# Patient Record
Sex: Female | Born: 1940 | Race: White | Hispanic: No | Marital: Married | Smoking: Never smoker
Health system: Southern US, Community
[De-identification: ages and names within clinical notes are randomized; demographics above are authoritative.]

## PROBLEM LIST (undated history)

## (undated) DIAGNOSIS — E119 Type 2 diabetes mellitus without complications: Secondary | ICD-10-CM

## (undated) DIAGNOSIS — I2510 Atherosclerotic heart disease of native coronary artery without angina pectoris: Secondary | ICD-10-CM

## (undated) HISTORY — PX: CARDIAC SURGERY: SHX584

---

## 2004-04-28 ENCOUNTER — Ambulatory Visit: Payer: Self-pay | Admitting: Gastroenterology

## 2004-05-09 ENCOUNTER — Ambulatory Visit: Payer: Self-pay | Admitting: Family Medicine

## 2004-05-16 ENCOUNTER — Ambulatory Visit: Payer: Self-pay | Admitting: Family Medicine

## 2004-05-22 ENCOUNTER — Ambulatory Visit: Payer: Self-pay | Admitting: Family Medicine

## 2004-06-22 ENCOUNTER — Ambulatory Visit: Payer: Self-pay | Admitting: Family Medicine

## 2004-07-22 ENCOUNTER — Ambulatory Visit: Payer: Self-pay | Admitting: Family Medicine

## 2004-08-22 ENCOUNTER — Ambulatory Visit: Payer: Self-pay | Admitting: Family Medicine

## 2004-10-22 ENCOUNTER — Ambulatory Visit: Payer: Self-pay | Admitting: Family Medicine

## 2005-01-03 ENCOUNTER — Ambulatory Visit: Payer: Self-pay | Admitting: Family Medicine

## 2005-02-27 ENCOUNTER — Ambulatory Visit: Payer: Self-pay | Admitting: Surgery

## 2005-05-29 ENCOUNTER — Ambulatory Visit: Payer: Self-pay | Admitting: Family Medicine

## 2005-08-22 ENCOUNTER — Other Ambulatory Visit: Payer: Self-pay

## 2005-08-22 ENCOUNTER — Inpatient Hospital Stay: Payer: Self-pay | Admitting: Internal Medicine

## 2006-07-17 ENCOUNTER — Ambulatory Visit: Payer: Self-pay | Admitting: Family Medicine

## 2007-08-14 ENCOUNTER — Ambulatory Visit: Payer: Self-pay | Admitting: Family Medicine

## 2007-11-23 ENCOUNTER — Ambulatory Visit: Payer: Self-pay | Admitting: Family Medicine

## 2007-12-23 ENCOUNTER — Ambulatory Visit: Payer: Self-pay | Admitting: Family Medicine

## 2008-08-20 ENCOUNTER — Ambulatory Visit: Payer: Self-pay | Admitting: Family Medicine

## 2009-09-08 ENCOUNTER — Ambulatory Visit: Payer: Self-pay | Admitting: Internal Medicine

## 2009-09-14 ENCOUNTER — Emergency Department: Payer: Self-pay | Admitting: Emergency Medicine

## 2010-03-19 ENCOUNTER — Emergency Department: Payer: Self-pay | Admitting: Emergency Medicine

## 2010-10-05 ENCOUNTER — Other Ambulatory Visit (HOSPITAL_COMMUNITY): Payer: Self-pay | Admitting: Radiology

## 2010-10-05 DIAGNOSIS — R55 Syncope and collapse: Secondary | ICD-10-CM

## 2010-10-06 ENCOUNTER — Other Ambulatory Visit (HOSPITAL_COMMUNITY): Payer: Self-pay | Admitting: Radiology

## 2010-10-06 ENCOUNTER — Ambulatory Visit (HOSPITAL_BASED_OUTPATIENT_CLINIC_OR_DEPARTMENT_OTHER): Payer: Medicare Other | Admitting: Radiology

## 2010-10-06 ENCOUNTER — Ambulatory Visit (HOSPITAL_COMMUNITY): Payer: Medicare Other | Attending: Internal Medicine | Admitting: Radiology

## 2010-10-06 DIAGNOSIS — E669 Obesity, unspecified: Secondary | ICD-10-CM | POA: Insufficient documentation

## 2010-10-06 DIAGNOSIS — E119 Type 2 diabetes mellitus without complications: Secondary | ICD-10-CM | POA: Insufficient documentation

## 2010-10-06 DIAGNOSIS — R55 Syncope and collapse: Secondary | ICD-10-CM

## 2010-10-06 DIAGNOSIS — E785 Hyperlipidemia, unspecified: Secondary | ICD-10-CM | POA: Insufficient documentation

## 2010-10-06 DIAGNOSIS — R0609 Other forms of dyspnea: Secondary | ICD-10-CM | POA: Insufficient documentation

## 2010-10-06 DIAGNOSIS — R0989 Other specified symptoms and signs involving the circulatory and respiratory systems: Secondary | ICD-10-CM | POA: Insufficient documentation

## 2010-10-06 DIAGNOSIS — R9431 Abnormal electrocardiogram [ECG] [EKG]: Secondary | ICD-10-CM | POA: Insufficient documentation

## 2010-10-25 ENCOUNTER — Ambulatory Visit: Payer: Self-pay | Admitting: Internal Medicine

## 2011-10-26 ENCOUNTER — Ambulatory Visit: Payer: Self-pay | Admitting: Internal Medicine

## 2011-12-27 ENCOUNTER — Ambulatory Visit: Payer: Self-pay | Admitting: Cardiology

## 2012-03-12 ENCOUNTER — Ambulatory Visit: Payer: Self-pay | Admitting: Internal Medicine

## 2012-05-15 ENCOUNTER — Ambulatory Visit: Payer: Self-pay | Admitting: Cardiology

## 2012-10-28 ENCOUNTER — Ambulatory Visit: Payer: Self-pay | Admitting: Family Medicine

## 2012-11-19 ENCOUNTER — Ambulatory Visit: Payer: Self-pay | Admitting: Family Medicine

## 2013-02-03 ENCOUNTER — Encounter: Payer: Self-pay | Admitting: Cardiovascular Disease

## 2013-02-10 ENCOUNTER — Ambulatory Visit: Payer: Self-pay | Admitting: Gastroenterology

## 2013-02-13 LAB — PATHOLOGY REPORT

## 2013-10-28 ENCOUNTER — Ambulatory Visit: Payer: Self-pay | Admitting: General Practice

## 2013-11-03 ENCOUNTER — Ambulatory Visit: Payer: Self-pay | Admitting: General Practice

## 2013-11-27 LAB — COMPREHENSIVE METABOLIC PANEL
ALBUMIN: 3.9 g/dL (ref 3.4–5.0)
ANION GAP: 10 (ref 7–16)
Alkaline Phosphatase: 59 U/L
BUN: 19 mg/dL — ABNORMAL HIGH (ref 7–18)
Bilirubin,Total: 0.5 mg/dL (ref 0.2–1.0)
CALCIUM: 9.5 mg/dL (ref 8.5–10.1)
CHLORIDE: 98 mmol/L (ref 98–107)
CO2: 28 mmol/L (ref 21–32)
Creatinine: 0.76 mg/dL (ref 0.60–1.30)
GLUCOSE: 162 mg/dL — AB (ref 65–99)
OSMOLALITY: 278 (ref 275–301)
Potassium: 3.9 mmol/L (ref 3.5–5.1)
SGOT(AST): 27 U/L (ref 15–37)
SGPT (ALT): 33 U/L
SODIUM: 136 mmol/L (ref 136–145)
TOTAL PROTEIN: 7.5 g/dL (ref 6.4–8.2)

## 2013-11-27 LAB — CBC
HCT: 38.6 % (ref 35.0–47.0)
HGB: 12.8 g/dL (ref 12.0–16.0)
MCH: 30.9 pg (ref 26.0–34.0)
MCHC: 33 g/dL (ref 32.0–36.0)
MCV: 94 fL (ref 80–100)
Platelet: 274 10*3/uL (ref 150–440)
RBC: 4.12 10*6/uL (ref 3.80–5.20)
RDW: 13.5 % (ref 11.5–14.5)
WBC: 11.2 10*3/uL — ABNORMAL HIGH (ref 3.6–11.0)

## 2013-11-27 LAB — TROPONIN I

## 2013-11-28 ENCOUNTER — Observation Stay: Payer: Self-pay | Admitting: Internal Medicine

## 2013-11-28 LAB — URINALYSIS, COMPLETE
BILIRUBIN, UR: NEGATIVE
BLOOD: NEGATIVE
Bacteria: NONE SEEN
Glucose,UR: 150 mg/dL (ref 0–75)
NITRITE: NEGATIVE
PH: 7 (ref 4.5–8.0)
PROTEIN: NEGATIVE
SPECIFIC GRAVITY: 1.048 (ref 1.003–1.030)

## 2013-11-28 LAB — TROPONIN I

## 2013-12-08 ENCOUNTER — Ambulatory Visit: Payer: Self-pay | Admitting: Family Medicine

## 2014-04-02 ENCOUNTER — Ambulatory Visit: Payer: Self-pay | Admitting: Gastroenterology

## 2014-05-15 NOTE — H&P (Signed)
PATIENT NAME:  Rachel Thompson, Rachel Thompson MR#:  [id number] DATE OF BIRTH:  18-Jun-1940  DATE OF ADMISSION:  11/28/2013  REFERRING PHYSICIAN: Rebecka ApleyAllison P. Webster, MD   PRIMARY CARE PHYSICIAN: Kandyce RudMarcus Babaoff, MD  CARDIOLOGIST: Marcina MillardAlexander Paraschos, MD  CHIEF COMPLAINT: Headache.  HISTORY OF PRESENT ILLNESS: A 74 year old Caucasian female with past medical history of coronary artery disease, status post PCI and stent placement x 2, as well as type 2 diabetes, non-insulin-requiring, uncomplicated, presenting with headache. She describes 1-day duration of headache which was originally frontal in location without radiation, throbbing in quality, 7/10 in intensity, nonradiating. No worsening or relieving factors. Then subsequently developed nausea, vomiting with p.o. intolerance, multiple bouts of nonbloody emesis prompting ER admission. While in the Emergency Department, workup has been essentially negative and ruling out any intracranial process or infectious etiologies, and her symptoms have also resolved, headache, as well vomiting; however, upon ambulation she became lightheaded and felt as if she was going  to pass out. Orthostatic vital signs were positive for orthostasis given change in heart rate. Currently no further complaints other than states "I feel dry."   REVIEW OF SYSTEMS:  CONSTITUTIONAL: Denies fever, chills, fatigue, weakness. EYES: No blurred vision, double vision, or eye pain.   ENT: Denies tinnitus, ear pain, hearing loss.  RESPIRATORY: Denies cough, wheeze, shortness of breath.  CARDIOVASCULAR: Denies chest pain, palpitations, edema. GASTROINTESTINAL: Positive for nausea and vomiting as above. Denies any abdominal pain. GENITOURINARY: Denies dysuria or hematuria. ENDOCRINE: Denied nocturia or thyroid problems. HEMATOLOGIC: Denies easy bruising or bleeding. SKIN: Denies rash or lesions. MUSCULOSKELETAL: Denies pain in neck, shoulder, knees, hip, or arthritic symptoms. NEUROLOGIC: She denies  paralysis or paresthesia.  PSYCHIATRIC: Denies anxiety or depressive symptoms.   Otherwise, full review of systems performed by me is negative.   PAST MEDICAL HISTORY: Gastroesophageal reflux disease; hyperlipidemia; type 2 diabetes, non-insulin-requiring, uncomplicated; hypothyroidism; coronary artery disease, status post PCI and stent placement.   SOCIAL HISTORY: Occasional alcohol use. Denies any drug use or tobacco use.   FAMILY HISTORY: Denies any known cardiovascular or pulmonary disorders.   ALLERGIES: SULFA DRUGS.   HOME MEDICATIONS: Include citalopram 10 mg p.o. daily, Janumet 50/1000 mg p.o. b.i.d., Lipitor 40 mg p.o. at bedtime, flaxseed oil 2 capsules p.o. daily, aspirin 81 mg p.o. 2 tablets daily, unknown dosage of Synthroid though she does take it once daily.   PHYSICAL EXAMINATION:  VITAL SIGNS: Temperature 98, heart rate currently 121, respirations 20, blood pressure 143/83, saturating 93% on room air. Orthostatic vital signs going from lying to standing: Heart rate 117, going up to 134; blood pressure 143/82, 156/78.  GENERAL: A well-nourished, well-developed Caucasian female currently in no acute distress.  HEAD: Normocephalic, atraumatic.  EYES: Pupils equal, round, reactive to light. Extraocular muscle intact. No scleral icterus.  MOUTH: Dry mucosal membranes. Dentition intact. No abscess noted.  EARS, NOSE, AND THROAT: Clear. No exudates. No external lesions.  NECK: Supple. No thyromegaly. No nodules. No JVD.  PULMONARY: Clear to auscultation bilaterally without wheezes, rales, or rhonchi. No use of accessory muscles. Good respiratory effort.   CHEST: Nontender to palpation.  CARDIOVASCULAR: S1, S2, regular rate and rhythm. No murmurs, rubs, or gallops. No edema. Pedal pulses 2+ bilaterally.  GASTROINTESTINAL: Soft, nontender, nondistended. No masses. Positive bowel sounds. No hepatosplenomegaly.  MUSCULOSKELETAL: No swelling, clubbing, or edema. Range of motion full  in all extremities.  NEUROLOGIC: Cranial nerves II through XII intact. No gross focal neurological deficits. Sensation intact. Reflexes intact. SKIN: No ulceration, lesions, cyanosis.  Skin warm, dry. Turgor intact.  PSYCHIATRIC: Mood and affect within normal limits. Awake, alert and oriented x 3. Insight and judgment intact.   LABORATORY DATA: EKG performed reveals right bundle branch block with sinus tachycardia. Sodium 136, potassium 3.9, chloride 98, bicarbonate 28, BUN 19, creatinine 0.76, glucose 162. Troponin less than 0.02. WBC 11.2, hemoglobin 12.8, platelets of 274,000.  Urinalysis: WBCs 13, RBCs 2, leukocyte esterase 1+. CT head performed: No intracranial process. 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   ASSESSMENT AND PLAN: A 74 year old  Caucasian female with history of coronary artery disease, status percutaneous coronary intervention and stent placement x 2 presented with headache. Workup has essentially been negative for headache. Her symptoms of headache as well as nausea and vomiting have resolved. However, when attempting to ambulate, she became lightheaded to the point where she almost passed out.  1.  Presyncope given poor p.o. intake and nausea, vomiting: Provide fluid hydration. Follow urine output and renal function.  2.  Coronary artery disease: Aspirin and statin therapy continuation.  3.  Type 2 diabetes, uncomplicated, non-insulin-requiring: Hold p.o. agents. Add insulin sliding scale q .6 hours, Accu-Cheks.  4.  Deep vein thrombosis prophylaxis: Heparin subcutaneous.  5.  The patient is FULL CODE.  TIME SPENT: 45 minutes.    ____________________________ Cletis Athens. Hower, MD dkh:ts D: 11/28/2013 03:14:00 ET T: 11/28/2013 03:55:16 ET JOB#: [id number]  cc: Cletis Athens. Hower, MD, <Dictator> DAVID Synetta Shadow MD ELECTRONICALLY SIGNED 11/28/2013 20:33

## 2014-05-15 NOTE — Discharge Summary (Signed)
PATIENT NAME:  Rachel Thompson, Rachel Thompson MR#:  [id number] DATE OF BIRTH:  11-May-1940  DATE OF ADMISSION:  11/28/2013 DATE OF DISCHARGE:  11/28/2013  PRIMARY CARDIOLOGIST: Marcina Millard, MD   CHIEF COMPLAINT AT THE TIME OF ADMISSION: Headache.  ADMITTING DIAGNOSES: Presyncope with poor p.o. intake and nausea and vomiting; coronary artery disease; type 2 diabetes mellitus.  DISCHARGE DIAGNOSES:  1.  Presyncope, probably vasovagal and migraine related.  2.  Intermittent episodes of migraine, recommended over-the-counter Excedrin Migraine as needed.   SECONDARY DISCHARGE DIAGNOSES: Coronary artery disease, stable, continue aspirin and statin. Diabetes mellitus, resume her home medications.   CONSULTATIONS: None.   PROCEDURES: None.  BRIEF HISTORY AND PHYSICAL AND HOSPITAL COURSE: The patient is a 74 year old Caucasian female who came into the ED with a chief complaint of headache and dizziness. She had 1 day history of headache in the frontal location, it is a 7/10, nonradiating. Subsequently, she developed nausea and vomiting with p.o. intolerance. The patient had multiple bouts of nonbloody emesis, came into the ED. Please review H and P for details. In the ED, orthostatic vital signs were positive for orthostasis. CAT scan of the head was performed, which has revealed no intracranial process. The patient was admitted to the hospital with a chief diagnosis of presyncope.   HOSPITAL COURSE: IV fluids were given. With IV fluids and antiemetic medications, her nausea and vomiting were resolved, dizziness was resolved. Repeat orthostatic vital signs were normal. Her blood pressure was 109/70 with pulse 114 while lying down and while standing it was 110/72 with pulse 118. Subsequently, the patient's headache was also resolved. The patient has reported that she has history of migraine headaches and she gets them intermittently. Presyncope was assumed to be from vasovagal from nausea and vomiting and nausea  and vomiting, were most probably from migraine headache. The patient's symptoms were completely resolved. Denies any chest pain or shortness of breath. Started feeling better. I have recommended the patient to take over-the-counter Excedrin Migraine as needed basis. The patient felt comfortable to be discharged.   Her initial urinalysis has revealed leukocyte esterase 1+ but the patient denies any abdominal pain, frequent urination, dysuria or burning micturition. As the patient was asymptomatic, antibiotics were not recommended. This was discussed with the patient and the patient also refused to take any p.o. antibiotics or further testing at this point as she was asymptomatic. The patient was discharged home under stable condition.   DIET: Diabetic, healthy heart.  ACTIVITY: As tolerated.   LABORATORIES AND IMAGING STUDIES: CAT scan of the head without contrast: No acute intracranial hemorrhage. No CT evidence of acute infarction. CT angiogram of the head and neck with and without contrast has revealed no aneurysm, vascular malformations. Calcified plaque within the distal left vertebral artery with associated stenosis of up to 30% by NASCET criteria. Left vertebral artery is dominant. CTA neck has revealed no evidence of dissection, vascular occlusion. Moderate calcific plaque at the proximal right internal carotid artery with associated short segment stenosis of 50% by NASCET criteria. Left carotid bifurcation with stenosis of approximately 50% by NASCET criteria.   EKG: Sinus tachycardia with positive left atrial enlargement, right bundle branch block, left anterior fascicular block.   Urinalysis: Leukocyte esterase 1+, nitrites are negative, urine glucose 150 mg/dL, blood negative. LFTs are normal. Troponin less than 0.02 x 3. WBC 11.2, hemoglobin 12.8, hematocrit 38.2, platelets are normal. BMP: Glucose 162, BUN 19, The rest of the BMP is normal.   MEDICATIONS AT THE TIME  OF DISCHARGE: Continue  all her home medications without any new changes. Tylenol 325 mg 1-2 tablets every 4 hours as needed for pain or temperature greater than 100.4, flaxseed oil 2 tablets p.o. once daily, citalopram 10 mg once daily, Janumet 50/ 1000 p.o. 2 times a day, Lipitor 40 mg once daily, fish oil once daily, calcium once daily, multivitamins once daily, baby aspirin 81 mg on a daily basis, levothyroxine dose unknown. Continue home medication at home dose.   FOLLOWUP: With primary care physician in 1-2 weeks and cardiology, Dr. Darrold JunkerParaschos, as scheduled as recommended. The patient is aware of the plan.  TOTAL TIME SPENT ON THE DISCHARGE: Forty-five minutes.    ____________________________ Ramonita LabAruna Ethelwyn Gilbertson, MD ag:TT D: 11/30/2013 20:02:23 ET T: 11/30/2013 20:31:58 ET JOB#: [id number]  cc: Ramonita LabAruna Jaylynn Mcaleer, MD, <Dictator> Kandyce RudMarcus Babaoff, MD Marcina MillardAlexander Paraschos, MD  Ramonita LabARUNA Alonah Lineback MD ELECTRONICALLY SIGNED 12/09/2013 14:10

## 2014-05-15 NOTE — H&P (Signed)
PATIENT NAME:  Rachel Thompson, Rachel Thompson MR#:  [id number] DATE OF BIRTH:  03/19/1940  DATE OF ADMISSION:  11/28/2013  REFERRING PHYSICIAN: Allison P. Webster, MD   PRIMARY CARE PHYSICIAN: Marcus Babaoff, MD  CARDIOLOGIST: Alexander Paraschos, MD  CHIEF COMPLAINT: Headache.  HISTORY OF PRESENT ILLNESS: A 74-year-old Caucasian female with past medical history of coronary artery disease, status post PCI and stent placement x 2, as well as type 2 diabetes, non-insulin-requiring, uncomplicated, presenting with headache. She describes 1-day duration of headache which was originally frontal in location without radiation, throbbing in quality, 7/10 in intensity, nonradiating. No worsening or relieving factors. Then subsequently developed nausea, vomiting with p.o. intolerance, multiple bouts of nonbloody emesis prompting ER admission. While in the Emergency Department, workup has been essentially negative and ruling out any intracranial process or infectious etiologies, and her symptoms have also resolved, headache, as well vomiting; however, upon ambulation she became lightheaded and felt as if she was going  to pass out. Orthostatic vital signs were positive for orthostasis given change in heart rate. Currently no further complaints other than states "I feel dry."   REVIEW OF SYSTEMS:  CONSTITUTIONAL: Denies fever, chills, fatigue, weakness. EYES: No blurred vision, double vision, or eye pain.   ENT: Denies tinnitus, ear pain, hearing loss.  RESPIRATORY: Denies cough, wheeze, shortness of breath.  CARDIOVASCULAR: Denies chest pain, palpitations, edema. GASTROINTESTINAL: Positive for nausea and vomiting as above. Denies any abdominal pain. GENITOURINARY: Denies dysuria or hematuria. ENDOCRINE: Denied nocturia or thyroid problems. HEMATOLOGIC: Denies easy bruising or bleeding. SKIN: Denies rash or lesions. MUSCULOSKELETAL: Denies pain in neck, shoulder, knees, hip, or arthritic symptoms. NEUROLOGIC: She denies  paralysis or paresthesia.  PSYCHIATRIC: Denies anxiety or depressive symptoms.   Otherwise, full review of systems performed by me is negative.   PAST MEDICAL HISTORY: Gastroesophageal reflux disease; hyperlipidemia; type 2 diabetes, non-insulin-requiring, uncomplicated; hypothyroidism; coronary artery disease, status post PCI and stent placement.   SOCIAL HISTORY: Occasional alcohol use. Denies any drug use or tobacco use.   FAMILY HISTORY: Denies any known cardiovascular or pulmonary disorders.   ALLERGIES: SULFA DRUGS.   HOME MEDICATIONS: Include citalopram 10 mg p.o. daily, Janumet 50/1000 mg p.o. b.i.d., Lipitor 40 mg p.o. at bedtime, flaxseed oil 2 capsules p.o. daily, aspirin 81 mg p.o. 2 tablets daily, unknown dosage of Synthroid though she does take it once daily.   PHYSICAL EXAMINATION:  VITAL SIGNS: Temperature 98, heart rate currently 121, respirations 20, blood pressure 143/83, saturating 93% on room air. Orthostatic vital signs going from lying to standing: Heart rate 117, going up to 134; blood pressure 143/82, 156/78.  GENERAL: A well-nourished, well-developed Caucasian female currently in no acute distress.  HEAD: Normocephalic, atraumatic.  EYES: Pupils equal, round, reactive to light. Extraocular muscle intact. No scleral icterus.  MOUTH: Dry mucosal membranes. Dentition intact. No abscess noted.  EARS, NOSE, AND THROAT: Clear. No exudates. No external lesions.  NECK: Supple. No thyromegaly. No nodules. No JVD.  PULMONARY: Clear to auscultation bilaterally without wheezes, rales, or rhonchi. No use of accessory muscles. Good respiratory effort.   CHEST: Nontender to palpation.  CARDIOVASCULAR: S1, S2, regular rate and rhythm. No murmurs, rubs, or gallops. No edema. Pedal pulses 2+ bilaterally.  GASTROINTESTINAL: Soft, nontender, nondistended. No masses. Positive bowel sounds. No hepatosplenomegaly.  MUSCULOSKELETAL: No swelling, clubbing, or edema. Range of motion full  in all extremities.  NEUROLOGIC: Cranial nerves II through XII intact. No gross focal neurological deficits. Sensation intact. Reflexes intact. SKIN: No ulceration, lesions, cyanosis.   Skin warm, dry. Turgor intact.  PSYCHIATRIC: Mood and affect within normal limits. Awake, alert and oriented x 3. Insight and judgment intact.   LABORATORY DATA: EKG performed reveals right bundle branch block with sinus tachycardia. Sodium 136, potassium 3.9, chloride 98, bicarbonate 28, BUN 19, creatinine 0.76, glucose 162. Troponin less than 0.02. WBC 11.2, hemoglobin 12.8, platelets of 274,000.  Urinalysis: WBCs 13, RBCs 2, leukocyte esterase 1+. CT head performed: No intracranial process. 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   ASSESSMENT AND PLAN: A 74-year-old  Caucasian female with history of coronary artery disease, status percutaneous coronary intervention and stent placement x 2 presented with headache. Workup has essentially been negative for headache. Her symptoms of headache as well as nausea and vomiting have resolved. However, when attempting to ambulate, she became lightheaded to the point where she almost passed out.  1.  Presyncope given poor p.o. intake and nausea, vomiting: Provide fluid hydration. Follow urine output and renal function.  2.  Coronary artery disease: Aspirin and statin therapy continuation.  3.  Type 2 diabetes, uncomplicated, non-insulin-requiring: Hold p.o. agents. Add insulin sliding scale q .6 hours, Accu-Cheks.  4.  Deep vein thrombosis prophylaxis: Heparin subcutaneous.  5.  The patient is FULL CODE.  TIME SPENT: 45 minutes.    ____________________________ David K. Hower, MD dkh:ts D: 11/28/2013 03:14:00 ET T: 11/28/2013 03:55:16 ET JOB#: [id number]  cc: David K. Hower, MD, <Dictator> DAVID K HOWER MD ELECTRONICALLY SIGNED 11/28/2013 20:33 

## 2014-05-17 LAB — SURGICAL PATHOLOGY

## 2014-12-21 ENCOUNTER — Emergency Department
Admission: EM | Admit: 2014-12-21 | Discharge: 2014-12-21 | Disposition: A | Discharge status: Home / Self Care | Payer: Medicare Other | Attending: Emergency Medicine | Admitting: Emergency Medicine

## 2014-12-21 ENCOUNTER — Encounter: Payer: Self-pay | Admitting: Emergency Medicine

## 2014-12-21 ENCOUNTER — Emergency Department: Payer: Medicare Other

## 2014-12-21 DIAGNOSIS — W1839XA Other fall on same level, initial encounter: Secondary | ICD-10-CM | POA: Diagnosis not present

## 2014-12-21 DIAGNOSIS — S3991XA Unspecified injury of abdomen, initial encounter: Secondary | ICD-10-CM | POA: Diagnosis present

## 2014-12-21 DIAGNOSIS — S3692XA Contusion of unspecified intra-abdominal organ, initial encounter: Secondary | ICD-10-CM

## 2014-12-21 DIAGNOSIS — E119 Type 2 diabetes mellitus without complications: Secondary | ICD-10-CM | POA: Insufficient documentation

## 2014-12-21 DIAGNOSIS — IMO0001 Reserved for inherently not codable concepts without codable children: Secondary | ICD-10-CM

## 2014-12-21 DIAGNOSIS — Y9289 Other specified places as the place of occurrence of the external cause: Secondary | ICD-10-CM | POA: Diagnosis not present

## 2014-12-21 DIAGNOSIS — Y9389 Activity, other specified: Secondary | ICD-10-CM | POA: Diagnosis not present

## 2014-12-21 DIAGNOSIS — S301XXA Contusion of abdominal wall, initial encounter: Secondary | ICD-10-CM | POA: Diagnosis not present

## 2014-12-21 DIAGNOSIS — Y998 Other external cause status: Secondary | ICD-10-CM | POA: Diagnosis not present

## 2014-12-21 HISTORY — DX: Type 2 diabetes mellitus without complications: E11.9

## 2014-12-21 HISTORY — DX: Atherosclerotic heart disease of native coronary artery without angina pectoris: I25.10

## 2014-12-21 LAB — URINALYSIS COMPLETE WITH MICROSCOPIC (ARMC ONLY)
Bacteria, UA: NONE SEEN
Bilirubin Urine: NEGATIVE
Glucose, UA: NEGATIVE mg/dL
Hgb urine dipstick: NEGATIVE
KETONES UR: NEGATIVE mg/dL
Nitrite: NEGATIVE
PROTEIN: NEGATIVE mg/dL
SQUAMOUS EPITHELIAL / LPF: NONE SEEN
Specific Gravity, Urine: 1.011 (ref 1.005–1.030)
pH: 6 (ref 5.0–8.0)

## 2014-12-21 LAB — CBC WITH DIFFERENTIAL/PLATELET
Basophils Absolute: 0.1 10*3/uL (ref 0–0.1)
Basophils Relative: 1 %
EOS ABS: 0.2 10*3/uL (ref 0–0.7)
EOS PCT: 3 %
HCT: 35.8 % (ref 35.0–47.0)
Hemoglobin: 11.8 g/dL — ABNORMAL LOW (ref 12.0–16.0)
LYMPHS ABS: 2.1 10*3/uL (ref 1.0–3.6)
Lymphocytes Relative: 27 %
MCH: 28.9 pg (ref 26.0–34.0)
MCHC: 32.9 g/dL (ref 32.0–36.0)
MCV: 87.9 fL (ref 80.0–100.0)
MONOS PCT: 10 %
Monocytes Absolute: 0.7 10*3/uL (ref 0.2–0.9)
Neutro Abs: 4.7 10*3/uL (ref 1.4–6.5)
Neutrophils Relative %: 59 %
PLATELETS: 303 10*3/uL (ref 150–440)
RBC: 4.08 MIL/uL (ref 3.80–5.20)
RDW: 13.8 % (ref 11.5–14.5)
WBC: 7.8 10*3/uL (ref 3.6–11.0)

## 2014-12-21 LAB — COMPREHENSIVE METABOLIC PANEL
ALK PHOS: 60 U/L (ref 38–126)
ALT: 29 U/L (ref 14–54)
ANION GAP: 10 (ref 5–15)
AST: 27 U/L (ref 15–41)
Albumin: 4.3 g/dL (ref 3.5–5.0)
BUN: 17 mg/dL (ref 6–20)
CALCIUM: 9.8 mg/dL (ref 8.9–10.3)
CHLORIDE: 102 mmol/L (ref 101–111)
CO2: 27 mmol/L (ref 22–32)
Creatinine, Ser: 0.71 mg/dL (ref 0.44–1.00)
Glucose, Bld: 147 mg/dL — ABNORMAL HIGH (ref 65–99)
Potassium: 3.9 mmol/L (ref 3.5–5.1)
SODIUM: 139 mmol/L (ref 135–145)
TOTAL PROTEIN: 8 g/dL (ref 6.5–8.1)
Total Bilirubin: 0.6 mg/dL (ref 0.3–1.2)

## 2014-12-21 MED ORDER — IOHEXOL 350 MG/ML SOLN
100.0000 mL | Freq: Once | INTRAVENOUS | Status: AC | PRN
  Administered 2014-12-21: 100 mL via INTRAVENOUS
  Filled 2014-12-21: qty 100

## 2014-12-21 MED ORDER — OXYCODONE-ACETAMINOPHEN 5-325 MG PO TABS
1.0000 | ORAL_TABLET | Freq: Once | ORAL | Status: AC
  Administered 2014-12-21: 1 via ORAL
  Filled 2014-12-21: qty 1

## 2014-12-21 MED ORDER — DOCUSATE SODIUM 100 MG PO CAPS: 100.0000 mg | ORAL_CAPSULE | Freq: Every day | ORAL | Status: AC | PRN

## 2014-12-21 MED ORDER — OXYCODONE-ACETAMINOPHEN 5-325 MG PO TABS: 1.0000 | ORAL_TABLET | Freq: Four times a day (QID) | ORAL | Status: DC | PRN

## 2014-12-21 MED ORDER — IBUPROFEN 400 MG PO TABS
600.0000 mg | ORAL_TABLET | Freq: Once | ORAL | Status: AC
  Administered 2014-12-21: 600 mg via ORAL
  Filled 2014-12-21: qty 2

## 2014-12-21 MED ORDER — IOHEXOL 240 MG/ML SOLN: 25.0000 mL | Freq: Once | INTRAMUSCULAR | Status: DC | PRN

## 2014-12-21 NOTE — ED Notes (Signed)
Pt has right upper quad pain.  Pt reports falling 1 week ago in a parking lot and has a bruise to right upper quad. Pt tripped over a cement block.  No loc.  No headache or neck pain.  Denies back pain.   Today pain below bruise.  No back pain.  No n/v/d.    Pt alert.  Speech clear.

## 2014-12-21 NOTE — ED Notes (Signed)
Pt returned from CT °

## 2014-12-21 NOTE — Discharge Instructions (Signed)
If you have increased pain, shortness of breath, fever, vomiting, chest pain, or you feel worse in any way, return to the emergency department. Please bring this discharge instruction to your doctor, as it contains (copied here below) your ct findings.  These will need to be followed up upon, specifically the lung nodule. Take pain medication to control your pain and return if you feel worse.   IMPRESSION, CT ABDOMEN: 1. No evidence of significant acute traumatic injury in the abdomen or pelvis. 2. No acute findings in the abdomen or pelvis to account for the patient's symptoms. 3. 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 4. Atherosclerosis including right coronary artery disease. Assessment for potential risk factor modification, dietary therapy or pharmacologic therapy may be warranted, if clinically indicated. 5. Moderate size hiatal hernia. 6. Incompletely visualized ground-glass attenuation nodule in the left lower lobe measuring at least 9 mm. Initial follow-up by chest CT without contrast is recommended in 3 months to confirm persistence. This recommendation follows the consensus statement: Recommendations for the Management of Subsolid Pulmonary Nodules Detected at CT: A Statement from the Fleischner Society as published in Radiology 2013; 266:304-317. 7. Mild colonic diverticulosis without evidence to suggest an acute diverticulitis at this time.   Blunt Abdominal Trauma Blunt abdominal trauma is a type of injury that involves damage to the abdominal wall or to abdominal organs, such as the liver or spleen. The damage can involve bruising, tearing, or a rupture. This type of injury does not involve a puncture of the skin. Blunt abdominal trauma can range from mild to severe. In some cases it can lead to a severe abdominal inflammation (peritonitis), severe bleeding, and a dangerous drop in blood pressure. CAUSES This injury is caused by a hard,  direct hit to the abdomen. It can happen after:  A motor vehicle accident.  Being kicked or punched in the abdomen.  Falling from a significant height. RISK FACTORS This injury is more likely to happen in people who:  Play contact sports.  Work in a job in which falls or injuries are more likely, such as in Holiday representative. SYMPTOMS The main symptom of this condition is pain in the abdomen. Other symptoms depend on the type and location of the injury. They can include:  Abdominal pain that spreads to the the back or shoulder.  Bruising.  Swelling.  Pain when pressing on the abdomen.  Blood in the urine.  Weakness.  Confusion.  Loss of consciousness.  Pale, dusky, cool, or sweaty skin.  Vomiting blood.  Bloody stool or bleeding from the rectum.  Trouble breathing. Symptoms of this injury can develop suddenly or slowly.  DIAGNOSIS This injury is diagnosed based on your symptoms and a physical exam. You may also have tests, including:  Blood tests.  Urine tests.  Imaging tests, such as:  A CT scan and ultrasound of your abdomen.  X-rays of your chest and abdomen.  A test in which a tube is used to flush your abdomen with fluid and check for blood (diagnostic peritoneal lavage). TREATMENT Treatment for this injury depends on its type and severity. Treatment options include:  Observation. If the injury is mild, this may be the only treatment needed.  Support of your blood pressure and breathing.  Getting blood, fluids, or medicine through an IV tube.  Antibiotic medicine.  Insertion of tubes into the stomach or bladder.  A blood transfusion.  A procedure to stop bleeding. This involves putting a long,  thin tube (catheter) into one of your blood vessels (angiographic embolization).  Surgery to open up your abdomen and control bleeding or repair damage (laparotomy). This may be done if tests suggest that you have peritonitis or bleeding that cannot be  controlled with angiographic embolization. HOME CARE INSTRUCTIONS  Take medicines only as directed by your health care provider.  If you were prescribed an antibiotic medicine, finish all of it even if you start to feel better.  Follow your health care provider's instructions about diet and activity restrictions.  Keep all follow-up visits as directed by your health care provider. This is important. SEEK MEDICAL CARE IF:  You continue to have abdominal pain.  Your symptoms return.  You develop new symptoms.  You have blood in your urine or your bowel movements. SEEK IMMEDIATE MEDICAL CARE IF:  You vomit blood.  You have heavy bleeding from your rectum.  You have very bad abdominal pain.  You have trouble breathing.  You have chest pain.  You have a fever.  You have dizziness.  You pass out.   This information is not intended to replace advice given to you by your health care provider. Make sure you discuss any questions you have with your health care provider.   Document Released: 02/16/2004 Document Revised: 05/25/2014 Document Reviewed: 12/30/2013 Elsevier Interactive Patient Education [employer]

## 2014-12-21 NOTE — ED Provider Notes (Addendum)
[facility] Emergency Department Provider Note  ____________________________________________   I have reviewed the triage vital signs and the nursing notes.   HISTORY  Chief Complaint Flank Pain    HPI Rachel Thompson is a 74 y.o. female who is healthy, on a baby aspirin otherwise on no other significant anticoagulation medications presents today with pain in her right upper quadrant. She states she had anoxic following week ago had some pain in her wrist but now the pain seems to have migrated into her abdomen and she had significant worsening of this abdominal pain in the right upper quadrant this afternoon. She's had no nausea no vomiting no diarrhea no hematemesis no bright red blood per rectum no melena she's been eating and drinking well with no fevers and she is not lightheaded. The pain is underneath the ribs on the right. She states that this pain has been there since this afternoon and it feels different from the pain that she had initially when she fell and hit her chest. It is worse when she touches it or changes position and she does not want any pain medication.  Past Medical History  Diagnosis Date  . Diabetes mellitus without complication (HCC)   . Coronary artery disease     There are no active problems to display for this patient.   Past Surgical History  Procedure Laterality Date  . Cardiac surgery      cardiac stents    No current outpatient prescriptions on file.  Allergies Sulfa antibiotics  No family history on file.  Social History Social History  Substance Use Topics  . Smoking status: Never Smoker   . Smokeless tobacco: Not on file  . Alcohol Use: Yes     Comment: socially    Review of Systems Constitutional: No fever/chills Eyes: No visual changes. ENT: No sore throat. No stiff neck no neck pain Cardiovascular: Denies chest pain. Respiratory: Denies shortness of breath. Gastrointestinal:   no vomiting.  No  diarrhea.  No constipation. Genitourinary: Negative for dysuria. Musculoskeletal: Negative lower extremity swelling Skin: Negative for rash. Neurological: Negative for headaches, focal weakness or numbness. 10-point ROS otherwise negative.  ____________________________________________   PHYSICAL EXAM:  VITAL SIGNS: ED Triage Vitals  Enc Vitals Group     BP 12/21/14 1729 158/75 mmHg     Pulse Rate 12/21/14 1729 57     Resp 12/21/14 1729 16     Temp 12/21/14 1729 97.4 F (36.3 C)     Temp Source 12/21/14 1729 Oral     SpO2 12/21/14 1729 99 %     Weight 12/21/14 1729 160 lb (72.576 kg)     Height 12/21/14 1729  (1.626 m)     Head Cir --      Peak Flow --      Pain Score 12/21/14 1731 8     Pain Loc --      Pain Edu? --      Excl. in GC? --     Constitutional: Alert and oriented. Well appearing and in no acute distress. Eyes: Conjunctivae are normal. PERRL. EOMI. Head: Atraumatic. Nose: No congestion/rhinnorhea. Mouth/Throat: Mucous membranes are moist.  Oropharynx non-erythematous. Neck: No stridor.   Nontender with no meningismus Cardiovascular: Normal rate, regular rhythm. Grossly normal heart sounds.  Good peripheral circulation. There is minimal tenderness to palpation of the ribs on the lower right. Respiratory: Normal respiratory effort.  No retractions. Lungs CTAB. Abdominal: There is some old bruising noted to the  right for quadrant and there is tender to palpation in the right upper quadrant which is not insignificant. There is voluntary guarding but no rebound. Abdomen is soft and obese. Back:  There is no focal tenderness or step off there is no midline tenderness there are no lesions noted. there is no CVA tenderness Musculoskeletal: No lower extremity tenderness. No joint effusions, no DVT signs strong distal pulses no edema Neurologic:  Normal speech and language. No gross focal neurologic deficits are appreciated.  Skin:  Skin is warm, dry and intact. No  rash noted. Psychiatric: Mood and affect are normal. Speech and behavior are normal.  ____________________________________________   LABS (all labs ordered are listed, but only abnormal results are displayed)  Labs Reviewed  CBC WITH DIFFERENTIAL/PLATELET - Abnormal; Notable for the following:    Hemoglobin 11.8 (*)    All other components within normal limits  COMPREHENSIVE METABOLIC PANEL - Abnormal; Notable for the following:    Glucose, Bld 147 (*)    All other components within normal limits  URINALYSIS COMPLETEWITH MICROSCOPIC ([facility] ONLY) - Abnormal; Notable for the following:    Color, Urine YELLOW (*)    APPearance CLEAR (*)    Leukocytes, UA 1+ (*)    All other components within normal limits   ____________________________________________  EKG  I personally interpreted any EKGs ordered by me or triage  ____________________________________________  RADIOLOGY  I reviewed any imaging ordered by me or triage that were performed during my shift ____________________________________________   PROCEDURES  Procedure(s) performed: None  Critical Care performed: None  ____________________________________________   INITIAL IMPRESSION / ASSESSMENT AND PLAN / ED COURSE  Pertinent labs & imaging results that were available during my care of the patient were reviewed by me and considered in my medical decision making (see chart for details).  Patient who fell a week ago has acute worsening of her right-sided pain in the exact area where she has a bruise but it does feel as if there could be potential for other intra-abdominal pathology. It would be unlikely for the patient have a gallbladder attack at the same time she has a bruise in that area, this certainly could just be the obvious abdominal wall hematoma which is causing her discomfort but I'll obtain a CT scan to further evaluate the structures therein. No evidence of pneumothorax  noted.   ----------------------------------------- 7:59 PM on 12/21/2014 -----------------------------------------  Patient made aware of all the CT findings, there is no acute intra-abdominal pathology that requires attention today. She is made aware of the lung nodules the patient has very reproducible abdominal wall pain right where she has a bruise from a fall with no evidence of intra-abdominal trauma or pathology. I do not take this represents a PE ACS or dissection. We will give her pain medication of which she is actually taken none since she fell and we will reassess. Patient understands the need for outpatient follow-up of her lung nodule and other CT findings.    ____________________________________________   FINAL CLINICAL IMPRESSION(S) / ED DIAGNOSES  Final diagnoses:  None     Jeanmarie PlantJames A McShane, MD 12/21/14 1848  Jeanmarie PlantJames A McShane, MD 12/21/14 [phone]

## 2014-12-21 NOTE — ED Notes (Signed)
Patient transported to CT 

## 2014-12-21 NOTE — ED Notes (Signed)
Fell after tripping in parking lot one week ago.  Initially c/o right rib pain that had improved.  Today RUQ pain began.  Pain worse with deep breathing.

## 2015-05-02 ENCOUNTER — Other Ambulatory Visit: Payer: Self-pay | Admitting: Family Medicine

## 2015-05-02 DIAGNOSIS — Z1231 Encounter for screening mammogram for malignant neoplasm of breast: Secondary | ICD-10-CM

## 2015-05-16 ENCOUNTER — Ambulatory Visit
Admission: RE | Admit: 2015-05-16 | Discharge: 2015-05-16 | Disposition: A | Discharge status: Home / Self Care | Payer: Medicare Other | Source: Ambulatory Visit | Attending: Family Medicine | Admitting: Family Medicine

## 2015-05-16 ENCOUNTER — Other Ambulatory Visit: Payer: Self-pay | Admitting: Family Medicine

## 2015-05-16 DIAGNOSIS — Z1231 Encounter for screening mammogram for malignant neoplasm of breast: Secondary | ICD-10-CM | POA: Insufficient documentation

## 2016-02-26 ENCOUNTER — Emergency Department: Payer: Medicare Other

## 2016-02-26 ENCOUNTER — Encounter: Payer: Self-pay | Admitting: Emergency Medicine

## 2016-02-26 ENCOUNTER — Emergency Department
Admission: EM | Admit: 2016-02-26 | Discharge: 2016-02-26 | Disposition: A | Discharge status: Home / Self Care | Payer: Medicare Other | Attending: Emergency Medicine | Admitting: Emergency Medicine

## 2016-02-26 DIAGNOSIS — I2510 Atherosclerotic heart disease of native coronary artery without angina pectoris: Secondary | ICD-10-CM | POA: Insufficient documentation

## 2016-02-26 DIAGNOSIS — M546 Pain in thoracic spine: Secondary | ICD-10-CM | POA: Insufficient documentation

## 2016-02-26 DIAGNOSIS — M542 Cervicalgia: Secondary | ICD-10-CM | POA: Diagnosis not present

## 2016-02-26 DIAGNOSIS — Z79899 Other long term (current) drug therapy: Secondary | ICD-10-CM | POA: Diagnosis not present

## 2016-02-26 DIAGNOSIS — M549 Dorsalgia, unspecified: Secondary | ICD-10-CM

## 2016-02-26 DIAGNOSIS — R079 Chest pain, unspecified: Secondary | ICD-10-CM | POA: Insufficient documentation

## 2016-02-26 DIAGNOSIS — E119 Type 2 diabetes mellitus without complications: Secondary | ICD-10-CM | POA: Insufficient documentation

## 2016-02-26 LAB — CBC
HEMATOCRIT: 34.1 % — AB (ref 35.0–47.0)
HEMOGLOBIN: 11.7 g/dL — AB (ref 12.0–16.0)
MCH: 30.8 pg (ref 26.0–34.0)
MCHC: 34.2 g/dL (ref 32.0–36.0)
MCV: 90.1 fL (ref 80.0–100.0)
PLATELETS: 307 10*3/uL (ref 150–440)
RBC: 3.78 MIL/uL — AB (ref 3.80–5.20)
RDW: 12.9 % (ref 11.5–14.5)
WBC: 5.2 10*3/uL (ref 3.6–11.0)

## 2016-02-26 LAB — DIFFERENTIAL
BASOS PCT: 1 %
Basophils Absolute: 0.1 10*3/uL (ref 0–0.1)
EOS ABS: 0.1 10*3/uL (ref 0–0.7)
Eosinophils Relative: 2 %
Lymphocytes Relative: 34 %
Lymphs Abs: 1.7 10*3/uL (ref 1.0–3.6)
MONO ABS: 0.6 10*3/uL (ref 0.2–0.9)
Monocytes Relative: 11 %
NEUTROS ABS: 2.7 10*3/uL (ref 1.4–6.5)
Neutrophils Relative %: 52 %

## 2016-02-26 LAB — COMPREHENSIVE METABOLIC PANEL
ALT: 19 U/L (ref 14–54)
ANION GAP: 10 (ref 5–15)
AST: 22 U/L (ref 15–41)
Albumin: 4.2 g/dL (ref 3.5–5.0)
Alkaline Phosphatase: 51 U/L (ref 38–126)
BUN: 28 mg/dL — ABNORMAL HIGH (ref 6–20)
CHLORIDE: 102 mmol/L (ref 101–111)
CO2: 26 mmol/L (ref 22–32)
Calcium: 9.4 mg/dL (ref 8.9–10.3)
Creatinine, Ser: 0.92 mg/dL (ref 0.44–1.00)
GFR, EST NON AFRICAN AMERICAN: 59 mL/min — AB (ref >60)
Glucose, Bld: 169 mg/dL — ABNORMAL HIGH (ref 65–99)
POTASSIUM: 4.3 mmol/L (ref 3.5–5.1)
SODIUM: 138 mmol/L (ref 135–145)
Total Bilirubin: 0.8 mg/dL (ref 0.3–1.2)
Total Protein: 7.6 g/dL (ref 6.5–8.1)

## 2016-02-26 LAB — TROPONIN I

## 2016-02-26 MED ORDER — MORPHINE SULFATE (PF) 2 MG/ML IV SOLN
2.0000 mg | Freq: Once | INTRAVENOUS | Status: AC
  Administered 2016-02-26: 2 mg via INTRAVENOUS
  Filled 2016-02-26: qty 1

## 2016-02-26 MED ORDER — ONDANSETRON HCL 4 MG/2ML IJ SOLN
4.0000 mg | Freq: Once | INTRAMUSCULAR | Status: AC
  Administered 2016-02-26: 4 mg via INTRAVENOUS
  Filled 2016-02-26: qty 2

## 2016-02-26 MED ORDER — HYDROCODONE-ACETAMINOPHEN 5-325 MG PO TABS: 0.5000 | ORAL_TABLET | ORAL | 0 refills | Status: DC | PRN

## 2016-02-26 MED ORDER — IOPAMIDOL (ISOVUE-370) INJECTION 76%
75.0000 mL | Freq: Once | INTRAVENOUS | Status: AC | PRN
  Administered 2016-02-26: 75 mL via INTRAVENOUS

## 2016-02-26 MED ORDER — ASPIRIN 81 MG PO CHEW
324.0000 mg | CHEWABLE_TABLET | Freq: Once | ORAL | Status: AC
  Administered 2016-02-26: 324 mg via ORAL
  Filled 2016-02-26: qty 4

## 2016-02-26 NOTE — ED Provider Notes (Signed)
[facility] Emergency Department Provider Note   ____________________________________________   First MD Initiated Contact with Patient 02/26/16 1102     (approximate)  I have reviewed the triage vital signs and the nursing notes.   HISTORY  Chief Complaint Back Pain    HPI Rachel Thompson is a 76 y.o. female history of known coronary disease, 2 previous stents  The patient presents today, reports that about 9:45 AM she began experiencing severe mid upper back pain. It shoots up from the middle of her back and radiates towards her shoulder blades on both sides. It began suddenly while she was bending forward in the bathroom. She did not feel any crack or pop, and she reports a sharp pain that radiates up towards her shoulders bilateral. 10 out of 10.  She does report that on Thursday she tripped and fell forward catching herself with both arms without sustaining injury other than a small abrasion on her right knee.   No shortness of breath. No nausea or vomiting. Denies "chest pain". No abdominal pain. No numbness tingling or weakness in the arms or legs.  Past Medical History:  Diagnosis Date  . Coronary artery disease   . Diabetes mellitus without complication (HCC)     There are no active problems to display for this patient.   Past Surgical History:  Procedure Laterality Date  . CARDIAC SURGERY     cardiac stents    Prior to Admission medications   Medication Sig Start Date End Date Taking? Authorizing Provider  HYDROcodone-acetaminophen (NORCO/VICODIN) 5-325 MG tablet Take 0.5 tablets by mouth every 4 (four) hours as needed for severe pain. 02/26/16   Sharyn Creamer, MD  oxyCODONE-acetaminophen (ROXICET) 5-325 MG tablet Take 1 tablet by mouth every 6 (six) hours as needed. 12/21/14   Jeanmarie Plant, MD    Allergies Sulfa antibiotics  Family History  Problem Relation Age of Onset  . Breast cancer Mother 56    Social History Social  History  Substance Use Topics  . Smoking status: Never Smoker  . Smokeless tobacco: Not on file  . Alcohol use Yes     Comment: socially    Review of Systems Constitutional: No fever/chills Eyes: No visual changes. ENT: No sore throat. Cardiovascular: Denies chest pain. Respiratory: Denies shortness of breath. Gastrointestinal: No abdominal pain.  No nausea, no vomiting.  No diarrhea.  No constipation. Genitourinary: Negative for dysuria. Musculoskeletal: See history of present illness, denies neck pain or lower back pain Skin: Negative for rash. Neurological: Negative for headaches, focal weakness or numbness.  Denies any ripping tearing or moving pain  10-point ROS otherwise negative.  ____________________________________________   PHYSICAL EXAM:  VITAL SIGNS: ED Triage Vitals  Enc Vitals Group     BP 02/26/16 1017 (!) 184/78     Pulse Rate 02/26/16 1017 78     Resp 02/26/16 1100 (!) 9     Temp 02/26/16 1017 97.7 F (36.5 C)     Temp Source 02/26/16 1017 Oral     SpO2 02/26/16 1017 98 %     Weight 02/26/16 1018 150 lb (68 kg)     Height 02/26/16 1018 5\' 3"  (1.6 m)     Head Circumference --      Peak Flow --      Pain Score 02/26/16 1018 10     Pain Loc --      Pain Edu? --      Excl. in GC? --  Constitutional: Alert and oriented. Appears in moderate pain, reports pain in her upper mid back. She does appear very uncomfortable. She is very pleasant. Eyes: Conjunctivae are normal. PERRL. EOMI. Head: Atraumatic. Nose: No congestion/rhinnorhea. Mouth/Throat: Mucous membranes are moist.  Oropharynx non-erythematous. Neck: No stridor.  No cervical spine tenderness. There is no reproducible midline thoracic or lumbar tenderness or deformity noted. No rashes on the back. No bruising. Cardiovascular: Normal rate, regular rhythm. Grossly normal heart sounds.  Good peripheral circulation. Respiratory: Normal respiratory effort.  No retractions. Lungs  CTAB. Gastrointestinal: Soft and nontender. No distention.  Musculoskeletal: No lower extremity tenderness nor edema.   Neurologic:  Normal speech and language. No gross focal neurologic deficits are appreciated. Strong palpable pulses in the feet bilaterally. No peripheral cyanosis. Skin:  Skin is warm, dry and intact. No rash noted. Psychiatric: Mood and affect are normal. Speech and behavior are normal.  ____________________________________________   LABS (all labs ordered are listed, but only abnormal results are displayed)  Labs Reviewed  CBC - Abnormal; Notable for the following:       Result Value   RBC 3.78 (*)    Hemoglobin 11.7 (*)    HCT 34.1 (*)    All other components within normal limits  COMPREHENSIVE METABOLIC PANEL - Abnormal; Notable for the following:    Glucose, Bld 169 (*)    BUN 28 (*)    GFR calc non Af Amer 59 (*)    All other components within normal limits  DIFFERENTIAL  TROPONIN I  TROPONIN I   ____________________________________________  EKG  Reviewed and interpreted by me at 10:15 AM Heart rate 80 QRS 128 QTC 500 Normal sinus rhythm, mild prolongation of the QT interval, right bundle branch block without ischemic change noted, possible old septal infarct Compared with her previous EKG from 11/28/2013, no significant changes noted ____________________________________________  RADIOLOGY  Dg Chest 2 View  Result Date: 02/26/2016 CLINICAL DATA:  Chest pain.  Fell 2 days ago. EXAM: CHEST  2 VIEW COMPARISON:  Chest x-ray 12/21/2014 FINDINGS: The cardiac silhouette, mediastinal and hilar contours are normal and stable. There is tortuosity and calcification of the thoracic aorta. Mild chronic bronchitic changes are noted. No definite infiltrates or effusions. Vague densities in the right lung laterally appear relatively stable. They could be related to remote rib injuries. No obvious acute rib fracture. IMPRESSION: No acute cardiopulmonary findings.  Vague densities in the right lower lobe laterally appears stable since prior study and may be related to remote trauma. No definite acute fractures. Electronically Signed   By: Rudie Meyer M.D.   On: 02/26/2016 12:16   Ct Angio Chest Pe W Or Wo Contrast  Result Date: 02/26/2016 CLINICAL DATA:  Increased neck and chest pain radiating to back. EXAM: CT ANGIOGRAPHY CHEST WITH CONTRAST TECHNIQUE: Multidetector CT imaging of the chest was performed using the standard protocol during bolus administration of intravenous contrast. Multiplanar CT image reconstructions and MIPs were obtained to evaluate the vascular anatomy. CONTRAST:  75 cc Isovue 370 COMPARISON:  Chest CT angiogram dated 08/22/2005. FINDINGS: Cardiovascular: There is no pulmonary embolism identified within the main, lobar or segmental pulmonary arteries bilaterally. Aortic atherosclerosis. No aortic aneurysm or dissection. Heart size is upper normal. No pericardial effusion. Coronary artery calcifications noted. Mediastinum/Nodes: No mass or enlarged lymph nodes within the mediastinum, perihilar or axillary regions. Small hiatal hernia. Trachea and central bronchi are unremarkable. Lungs/Pleura: Small nodular density at the left lung base (series 6, image 40) is stable indicating  benignity. No pneumonia. No pleural effusion or pneumothorax. Upper Abdomen: No acute abnormality. Musculoskeletal: Mild degenerative spurring within the mid thoracic spine with kyphosis. Old healed rib fractures on the right. No acute or suspicious osseous finding. Review of the MIP images confirms the above findings. IMPRESSION: 1. No acute findings. No pulmonary embolism identified. No pneumonia or pulmonary edema. 2. Coronary artery calcifications, particularly dense within the left main and left anterior descending coronary arteries. Recommend correlation with any possible associated cardiac symptoms. 3. Aortic atherosclerosis. 4. Small hiatal hernia. Electronically  Signed   By: Bary Richard M.D.   On: 02/26/2016 14:14   Ct Cervical Spine Wo Contrast  Result Date: 02/26/2016 CLINICAL DATA:  Worsening neck and chest pain radiating to the back/ shoulders. No reported injury. EXAM: CT CERVICAL SPINE WITHOUT CONTRAST TECHNIQUE: Multidetector CT imaging of the cervical spine was performed without intravenous contrast. Multiplanar CT image reconstructions were also generated. COMPARISON:  11/27/2013 CT angiogram of the head and neck. FINDINGS: Alignment: Straightening of the cervical spine. Minimal 2 mm retrolisthesis at C5-6. Otherwise no subluxation. Dens is well positioned between the lateral masses of C1. Skull base and vertebrae: No acute fracture. No primary bone lesion or focal pathologic process. Soft tissues and spinal canal: No prevertebral fluid or swelling. No visible canal hematoma. 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 Mild-to-moderate bilateral facet arthropathy. Mild degenerative foraminal stenosis on the right at C4-5. Mild degenerative foraminal stenosis on the left at C5-6. Upper chest: Negative. Other: Visualized mastoid air cells appear clear. No discrete thyroid nodules. No pathologically enlarged cervical nodes. IMPRESSION: 1. No cervical spine fracture. 2. Moderate degenerative changes in the cervical spine as described. 3. Minimal 2 mm retrolisthesis at C5-6, probably degenerative. Electronically Signed   By: Delbert Phenix M.D.   On: 02/26/2016 14:07    ____________________________________________   PROCEDURES  Procedure(s) performed: None  Procedures  Critical Care performed: No  ____________________________________________   INITIAL IMPRESSION / ASSESSMENT AND PLAN / ED COURSE  Pertinent labs & imaging results that were available during my care of the patient were reviewed by me and considered in my medical decision making (see chart for  details).  Patient was for evaluation of back pain. Started while bending forward, and very atypical and not suggestive of an acute coronary syndrome but does carry a history of. No ripping tearing or moving pain is suggestive dissection, denies dyspnea or shortness of breath are gray against pulmonary embolism. I'm concerned primarily about musculoskeletal etiology given the history provided, she is hemodynamically stable with no distress noted.  Workup performed, CT scan negative for acute dissection or pulmonary embolism. In addition, concern for possible calcifications of this was not a dedicated CT coronary, this was discussed along with the patient's presentation with the patient's primary cardiologist.     Clinical Course as of Feb 26 1600  Wynelle Link Feb 26, 2016  1438 Case and care discussed with Dr. Cassie Freer, the patient's personal cardiologist. He advises that if a second troponin is negative and he recommends discharge to follow-up on Thursday with him in the clinic. I discussed this plan with the patient, and she reports that right now she has no pain except when she goes to twist or bend. I think this is reasonable, her CT and your findings are nonspecific and at the present time she denies any chest pain or pulmonary symptoms. Appears most likely musculoskeletal in etiology. We'll send a  second troponin now.  [MQ]    Clinical Course User Index [MQ] Sharyn CreamerMark Shmuel Girgis, MD   ----------------------------------------- 4:01 PM on 02/26/2016 -----------------------------------------  Repeat EKG from 1445 unchanged. No evidence of new ischemia. Second troponin is negative. Patient resting comfortably comfortable with plan for discharge and follow-up.  Return precautions and treatment recommendations and follow-up discussed with the patient who is agreeable with the plan.   ____________________________________________   FINAL CLINICAL IMPRESSION(S) / ED DIAGNOSES  Final diagnoses:  Back pain   Acute midline thoracic back pain      NEW MEDICATIONS STARTED DURING THIS VISIT:  New Prescriptions   HYDROCODONE-ACETAMINOPHEN (NORCO/VICODIN) 5-325 MG TABLET    Take 0.5 tablets by mouth every 4 (four) hours as needed for severe pain.     Note:  This document was prepared using Dragon voice recognition software and may include unintentional dictation errors.     Sharyn CreamerMark Tatisha Cerino, MD 02/26/16 [phone]

## 2016-02-26 NOTE — ED Notes (Signed)
Patient transported to CT 

## 2016-02-26 NOTE — ED Triage Notes (Signed)
Pt to triage desk with c/o back pain radiating down both arms and concerns that she was having a MI. Pt with stents x 2, DM. Pt states that she fell 2 days ago as an after thought.

## 2016-02-26 NOTE — Discharge Instructions (Signed)
You have been seen in the Emergency Department (ED) today for back pain.  As we have discussed today?s test results are reassuring, but you need to follow-up with Dr. Cassie FreerParachos on Thursday (as scheduled.)  Please follow up with the recommended doctor as instructed above in these documents regarding today?s emergent visit and your recent symptoms to discuss further management.  Continue to take your regular medications. If you are not doing so already, please also take a daily baby aspirin (81 mg), at least until you follow up with your doctor.  Return to the Emergency Department (ED) if you experience any worsening back pain, ANY chest pain/pressure/tightness, difficulty breathing, or sudden sweating, or other symptoms that concern you.

## 2016-07-26 ENCOUNTER — Other Ambulatory Visit: Payer: Self-pay | Admitting: Family Medicine

## 2016-07-26 DIAGNOSIS — Z1231 Encounter for screening mammogram for malignant neoplasm of breast: Secondary | ICD-10-CM

## 2016-08-30 ENCOUNTER — Ambulatory Visit
Admission: RE | Admit: 2016-08-30 | Discharge: 2016-08-30 | Disposition: A | Discharge status: Home / Self Care | Payer: Medicare Other | Source: Ambulatory Visit | Attending: Family Medicine | Admitting: Family Medicine

## 2016-08-30 DIAGNOSIS — Z1231 Encounter for screening mammogram for malignant neoplasm of breast: Secondary | ICD-10-CM | POA: Insufficient documentation

## 2016-10-17 ENCOUNTER — Observation Stay (HOSPITAL_COMMUNITY): Payer: Medicare Other

## 2016-10-17 ENCOUNTER — Emergency Department (HOSPITAL_COMMUNITY): Payer: Medicare Other

## 2016-10-17 ENCOUNTER — Observation Stay (HOSPITAL_COMMUNITY)
Admission: EM | Admit: 2016-10-17 | Discharge: 2016-10-18 | Disposition: A | Discharge status: Home / Self Care | Payer: Medicare Other | Attending: Internal Medicine | Admitting: Internal Medicine

## 2016-10-17 ENCOUNTER — Encounter (HOSPITAL_COMMUNITY): Payer: Self-pay

## 2016-10-17 DIAGNOSIS — R29810 Facial weakness: Secondary | ICD-10-CM | POA: Diagnosis not present

## 2016-10-17 DIAGNOSIS — Z955 Presence of coronary angioplasty implant and graft: Secondary | ICD-10-CM | POA: Insufficient documentation

## 2016-10-17 DIAGNOSIS — R29898 Other symptoms and signs involving the musculoskeletal system: Secondary | ICD-10-CM | POA: Diagnosis present

## 2016-10-17 DIAGNOSIS — F419 Anxiety disorder, unspecified: Secondary | ICD-10-CM | POA: Diagnosis present

## 2016-10-17 DIAGNOSIS — N289 Disorder of kidney and ureter, unspecified: Secondary | ICD-10-CM | POA: Diagnosis not present

## 2016-10-17 DIAGNOSIS — N182 Chronic kidney disease, stage 2 (mild): Secondary | ICD-10-CM | POA: Insufficient documentation

## 2016-10-17 DIAGNOSIS — E1122 Type 2 diabetes mellitus with diabetic chronic kidney disease: Secondary | ICD-10-CM | POA: Insufficient documentation

## 2016-10-17 DIAGNOSIS — E039 Hypothyroidism, unspecified: Secondary | ICD-10-CM | POA: Diagnosis present

## 2016-10-17 DIAGNOSIS — Z882 Allergy status to sulfonamides status: Secondary | ICD-10-CM | POA: Insufficient documentation

## 2016-10-17 DIAGNOSIS — Z7982 Long term (current) use of aspirin: Secondary | ICD-10-CM | POA: Insufficient documentation

## 2016-10-17 DIAGNOSIS — I2583 Coronary atherosclerosis due to lipid rich plaque: Secondary | ICD-10-CM | POA: Diagnosis not present

## 2016-10-17 DIAGNOSIS — E785 Hyperlipidemia, unspecified: Secondary | ICD-10-CM | POA: Diagnosis present

## 2016-10-17 DIAGNOSIS — Z7984 Long term (current) use of oral hypoglycemic drugs: Secondary | ICD-10-CM | POA: Insufficient documentation

## 2016-10-17 DIAGNOSIS — D649 Anemia, unspecified: Secondary | ICD-10-CM | POA: Diagnosis present

## 2016-10-17 DIAGNOSIS — I2510 Atherosclerotic heart disease of native coronary artery without angina pectoris: Secondary | ICD-10-CM | POA: Diagnosis not present

## 2016-10-17 DIAGNOSIS — R531 Weakness: Secondary | ICD-10-CM | POA: Insufficient documentation

## 2016-10-17 DIAGNOSIS — I129 Hypertensive chronic kidney disease with stage 1 through stage 4 chronic kidney disease, or unspecified chronic kidney disease: Secondary | ICD-10-CM | POA: Insufficient documentation

## 2016-10-17 DIAGNOSIS — G459 Transient cerebral ischemic attack, unspecified: Principal | ICD-10-CM | POA: Diagnosis present

## 2016-10-17 DIAGNOSIS — Z79899 Other long term (current) drug therapy: Secondary | ICD-10-CM | POA: Insufficient documentation

## 2016-10-17 LAB — I-STAT CHEM 8, ED
BUN: 29 mg/dL — AB (ref 6–20)
CALCIUM ION: 1.17 mmol/L (ref 1.15–1.40)
CHLORIDE: 106 mmol/L (ref 101–111)
CREATININE: 1.2 mg/dL — AB (ref 0.44–1.00)
Glucose, Bld: 156 mg/dL — ABNORMAL HIGH (ref 65–99)
HCT: 32 % — ABNORMAL LOW (ref 36.0–46.0)
Hemoglobin: 10.9 g/dL — ABNORMAL LOW (ref 12.0–15.0)
Potassium: 3.8 mmol/L (ref 3.5–5.1)
SODIUM: 139 mmol/L (ref 135–145)
TCO2: 23 mmol/L (ref 22–32)

## 2016-10-17 LAB — COMPREHENSIVE METABOLIC PANEL
ALT: 17 U/L (ref 14–54)
AST: 23 U/L (ref 15–41)
Albumin: 3.8 g/dL (ref 3.5–5.0)
Alkaline Phosphatase: 43 U/L (ref 38–126)
Anion gap: 10 (ref 5–15)
BUN: 27 mg/dL — ABNORMAL HIGH (ref 6–20)
CO2: 20 mmol/L — ABNORMAL LOW (ref 22–32)
Calcium: 9.4 mg/dL (ref 8.9–10.3)
Chloride: 105 mmol/L (ref 101–111)
Creatinine, Ser: 1.16 mg/dL — ABNORMAL HIGH (ref 0.44–1.00)
GFR calc Af Amer: 51 mL/min — ABNORMAL LOW (ref >60)
GFR calc non Af Amer: 44 mL/min — ABNORMAL LOW (ref >60)
Glucose, Bld: 155 mg/dL — ABNORMAL HIGH (ref 65–99)
Potassium: 3.8 mmol/L (ref 3.5–5.1)
Sodium: 135 mmol/L (ref 135–145)
Total Bilirubin: 0.4 mg/dL (ref 0.3–1.2)
Total Protein: 6.9 g/dL (ref 6.5–8.1)

## 2016-10-17 LAB — CK: Total CK: 31 U/L — ABNORMAL LOW (ref 38–234)

## 2016-10-17 LAB — I-STAT TROPONIN, ED: Troponin i, poc: 0 ng/mL (ref 0.00–0.08)

## 2016-10-17 LAB — DIFFERENTIAL
BASOS ABS: 0 10*3/uL (ref 0.0–0.1)
Basophils Relative: 1 %
Eosinophils Absolute: 0.2 10*3/uL (ref 0.0–0.7)
Eosinophils Relative: 2 %
LYMPHS ABS: 2 10*3/uL (ref 0.7–4.0)
LYMPHS PCT: 29 %
Monocytes Absolute: 0.7 10*3/uL (ref 0.1–1.0)
Monocytes Relative: 10 %
NEUTROS ABS: 4.1 10*3/uL (ref 1.7–7.7)
NEUTROS PCT: 58 %

## 2016-10-17 LAB — CBC
HCT: 31.5 % — ABNORMAL LOW (ref 36.0–46.0)
Hemoglobin: 10.3 g/dL — ABNORMAL LOW (ref 12.0–15.0)
MCH: 28.9 pg (ref 26.0–34.0)
MCHC: 32.7 g/dL (ref 30.0–36.0)
MCV: 88.2 fL (ref 78.0–100.0)
PLATELETS: 325 10*3/uL (ref 150–400)
RBC: 3.57 MIL/uL — AB (ref 3.87–5.11)
RDW: 13.4 % (ref 11.5–15.5)
WBC: 7 10*3/uL (ref 4.0–10.5)

## 2016-10-17 LAB — APTT: aPTT: 28 seconds (ref 24–36)

## 2016-10-17 LAB — PROTIME-INR
INR: 1.01
Prothrombin Time: 13.2 seconds (ref 11.4–15.2)

## 2016-10-17 LAB — GLUCOSE, CAPILLARY: GLUCOSE-CAPILLARY: 108 mg/dL — AB (ref 65–99)

## 2016-10-17 MED ORDER — ACETAMINOPHEN 650 MG RE SUPP: 650.0000 mg | RECTAL | Status: DC | PRN

## 2016-10-17 MED ORDER — ASPIRIN 81 MG PO CHEW
324.0000 mg | CHEWABLE_TABLET | Freq: Once | ORAL | Status: AC
  Administered 2016-10-17: 324 mg via ORAL
  Filled 2016-10-17: qty 4

## 2016-10-17 MED ORDER — SODIUM CHLORIDE 0.9 % IV SOLN
INTRAVENOUS | Status: AC
  Administered 2016-10-18: 03:00:00 via INTRAVENOUS

## 2016-10-17 MED ORDER — ENOXAPARIN SODIUM 40 MG/0.4ML ~~LOC~~ SOLN
40.0000 mg | SUBCUTANEOUS | Status: DC
  Filled 2016-10-17: qty 0.4

## 2016-10-17 MED ORDER — HYDROCODONE-ACETAMINOPHEN 5-325 MG PO TABS: 1.0000 | ORAL_TABLET | ORAL | Status: DC | PRN

## 2016-10-17 MED ORDER — OMEGA-3-ACID ETHYL ESTERS 1 G PO CAPS
1.0000 g | ORAL_CAPSULE | Freq: Two times a day (BID) | ORAL | Status: DC
  Administered 2016-10-18: 1 g via ORAL
  Filled 2016-10-17: qty 1

## 2016-10-17 MED ORDER — ASPIRIN 300 MG RE SUPP: 300.0000 mg | Freq: Every day | RECTAL | Status: DC

## 2016-10-17 MED ORDER — LEVOTHYROXINE SODIUM 100 MCG PO TABS
100.0000 ug | ORAL_TABLET | Freq: Every day | ORAL | Status: DC
  Administered 2016-10-18: 100 ug via ORAL
  Filled 2016-10-17: qty 1

## 2016-10-17 MED ORDER — PANTOPRAZOLE SODIUM 40 MG PO TBEC
40.0000 mg | DELAYED_RELEASE_TABLET | Freq: Every day | ORAL | Status: DC
  Administered 2016-10-18: 40 mg via ORAL
  Filled 2016-10-17: qty 1

## 2016-10-17 MED ORDER — CITALOPRAM HYDROBROMIDE 10 MG PO TABS
10.0000 mg | ORAL_TABLET | Freq: Every day | ORAL | Status: DC
  Administered 2016-10-18: 10 mg via ORAL
  Filled 2016-10-17: qty 1

## 2016-10-17 MED ORDER — STROKE: EARLY STAGES OF RECOVERY BOOK
Freq: Once | Status: AC
  Administered 2016-10-18: 03:00:00
  Filled 2016-10-17: qty 1

## 2016-10-17 MED ORDER — INSULIN ASPART 100 UNIT/ML ~~LOC~~ SOLN: 0.0000 [IU] | Freq: Every day | SUBCUTANEOUS | Status: DC

## 2016-10-17 MED ORDER — INSULIN ASPART 100 UNIT/ML ~~LOC~~ SOLN
0.0000 [IU] | Freq: Three times a day (TID) | SUBCUTANEOUS | Status: DC
  Administered 2016-10-18: 1 [IU] via SUBCUTANEOUS

## 2016-10-17 MED ORDER — ASPIRIN 325 MG PO TABS
325.0000 mg | ORAL_TABLET | Freq: Every day | ORAL | Status: DC
  Administered 2016-10-18: 325 mg via ORAL
  Filled 2016-10-17: qty 1

## 2016-10-17 MED ORDER — SENNOSIDES-DOCUSATE SODIUM 8.6-50 MG PO TABS: 1.0000 | ORAL_TABLET | Freq: Every evening | ORAL | Status: DC | PRN

## 2016-10-17 MED ORDER — ACETAMINOPHEN 325 MG PO TABS: 650.0000 mg | ORAL_TABLET | ORAL | Status: DC | PRN

## 2016-10-17 MED ORDER — ACETAMINOPHEN 160 MG/5ML PO SOLN: 650.0000 mg | ORAL | Status: DC | PRN

## 2016-10-17 MED ORDER — LORAZEPAM 2 MG/ML IJ SOLN
1.0000 mg | INTRAMUSCULAR | Status: DC | PRN
  Administered 2016-10-17: 1 mg via INTRAVENOUS
  Filled 2016-10-17: qty 1

## 2016-10-17 NOTE — ED Notes (Signed)
Patient transported to CT 

## 2016-10-17 NOTE — ED Provider Notes (Signed)
[facility]-EMERGENCY DEPT Provider Note   CSN: [id number] Arrival date & time: 10/17/16  1753     History   Chief Complaint Chief Complaint  Patient presents with  . Transient Ischemic Attack    HPI Rachel Thompson is a 76 y.o. female.  HPI   Patient 76 year old female with past mental history of CAD and diabetes. She is presenting with TIA symptoms. Patient had acute onset of left hand numbness during work today. She reports that she was working and then had some visual changes, numbness and tingling to her left hand that radiated up her arm. When her coworker came and they reported a left facial droop.all symptoms resolved.  Past Medical History:  Diagnosis Date  . Coronary artery disease   . Diabetes mellitus without complication (HCC)     There are no active problems to display for this patient.   Past Surgical History:  Procedure Laterality Date  . CARDIAC SURGERY     cardiac stents    OB History    No data available       Home Medications    Prior to Admission medications   Medication Sig Start Date End Date Taking? Authorizing Provider  HYDROcodone-acetaminophen (NORCO/VICODIN) 5-325 MG tablet Take 0.5 tablets by mouth every 4 (four) hours as needed for severe pain. 02/26/16   Sharyn Creamer, MD  oxyCODONE-acetaminophen (ROXICET) 5-325 MG tablet Take 1 tablet by mouth every 6 (six) hours as needed. 12/21/14   Jeanmarie Plant, MD    Family History Family History  Problem Relation Age of Onset  . Breast cancer Mother 52    Social History Social History  Substance Use Topics  . Smoking status: Never Smoker  . Smokeless tobacco: Never Used  . Alcohol use Yes     Comment: socially     Allergies   Sulfa antibiotics   Review of Systems Review of Systems  Constitutional: Negative for activity change, fatigue and fever.  Eyes: Positive for visual disturbance.  Respiratory: Negative for shortness of breath.   Cardiovascular: Negative for chest pain.    Gastrointestinal: Negative for abdominal pain.  Neurological: Positive for numbness. Negative for dizziness, seizures, syncope, speech difficulty, weakness and headaches.     Physical Exam Updated Vital Signs BP (!) 155/75 (BP Location: Left Arm)   Pulse 95   Temp 98.4 F (36.9 C) (Oral)   Resp (!) 22   Ht  (1.549 m)   Wt 68.5 kg (151 lb)   SpO2 96%   BMI 28.53 kg/m   Physical Exam  Constitutional: She is oriented to person, place, and time. She appears well-developed and well-nourished.  HENT:  Head: Normocephalic and atraumatic.  Eyes: Right eye exhibits no discharge. Left eye exhibits no discharge.  Cardiovascular: Normal rate and regular rhythm.   Pulmonary/Chest: Effort normal and breath sounds normal. No respiratory distress.  Abdominal: Soft. She exhibits no distension. There is no tenderness.  Neurological: She is oriented to person, place, and time. No cranial nerve deficit. Coordination normal.  Skin: Skin is warm and dry. She is not diaphoretic.  Psychiatric: She has a normal mood and affect.  Nursing note and vitals reviewed.    ED Treatments / Results  Labs (all labs ordered are listed, but only abnormal results are displayed) Labs Reviewed  CBC - Abnormal; Notable for the following:       Result Value   RBC 3.57 (*)    Hemoglobin 10.3 (*)    HCT 31.5 (*)  All other components within normal limits  I-STAT CHEM 8, ED - Abnormal; Notable for the following:    BUN 29 (*)    Creatinine, Ser 1.20 (*)    Glucose, Bld 156 (*)    Hemoglobin 10.9 (*)    HCT 32.0 (*)    All other components within normal limits  DIFFERENTIAL  PROTIME-INR  APTT  COMPREHENSIVE METABOLIC PANEL  I-STAT TROPONIN, ED    EKG  EKG Interpretation None       Radiology No results found.  Procedures Procedures (including critical care time)  Medications Ordered in ED Medications - No data to display   Initial Impression / Assessment and Plan / ED Course  I  have reviewed the triage vital signs and the nursing notes.  Pertinent labs & imaging results that were available during my care of the patient were reviewed by me and considered in my medical decision making (see chart for details).    Patient 77 year old female with past mental history of CAD and diabetes. She is presenting with TIA symptoms. Patient had acute onset of left hand numbness during work today. She reports that she was working and then had some visual changes, numbness and tingling to her left hand that radiated up her arm. When her coworker came and they reported a left facial droop.all symptoms resolved.   7:07 PM Discussed with nuerologist. Will admit to medicne for TIA work up. Of note, patient refuses MRI due to anxiety.   Final Clinical Impressions(s) / ED Diagnoses   Final diagnoses:  None    New Prescriptions New Prescriptions   No medications on file     Abelino Derrick, MD 10/17/16 [phone]

## 2016-10-17 NOTE — Consult Note (Signed)
Requesting Physician: Dr.    Antony Contras Complaint: Left hand tingling and left facial droop  History obtained from:  Patient and chart review  HPI:                                                                                                                                       Rachel Thompson is an 76 y.o. female white right-handed with past medical history of coronary artery disease status post stent and diabetes mellitus presents with sudden onset numbness over her left hand and facial droop. According to patient she was at work today at 4:15 PM when she noticed her left hand become numb and tingling which gradually extended to the wrist. Her supervisor also noticed she had mild left facial droop. Her symptoms lasted for about 5-10 minutes and her symptoms resolved with the time she arrived to the emergency department. He denies slurring of speech, difficulty getting words out, loss of vision or weakness. The patient does have history migraines as a teenager and states that she had been  having a headache for the last 4 days which is unusual for her. Her headache is resolved after being in the hospital. Patient was admitted for TIA workup and neurology was consult to for evaluation   Date last known well:  9.26.18 Time last known well: 4.15 pm tPA Given: No, resolved symptoms NIHSS 0 ( 1 by nurse for mild left facial droop) Modified Rankin: 0   Past Medical History:  Diagnosis Date  . Coronary artery disease   . Diabetes mellitus without complication [facility])     Past Surgical History:  Procedure Laterality Date  . CARDIAC SURGERY     cardiac stents    Family History  Problem Relation Age of Onset  . Breast cancer Mother 1   Social History:  reports that she has never smoked. She has never used smokeless tobacco. She reports that she drinks alcohol. She reports that she does not use drugs.  Allergies:  Allergies  Allergen Reactions  . Sulfa Antibiotics Rash    Medications:                                                                                                                            I reviewed her medications  ROS:  14 point review of systems is negative   Examination:                                                                                                      General: Appears well-developed and well-nourished.  Psych: Affect appropriate to situation Eyes: No scleral injection HENT: No OP obstrucion Head: Normocephalic.  Cardiovascular: Normal rate and regular rhythm.  Respiratory: Effort normal and breath sounds normal to anterior ascultation GI: Soft.  No distension. There is no tenderness.  Skin: WDI   Neurological Examination Mental Status: Alert, oriented, thought content appropriate.  Speech fluent without evidence of aphasia.  Able to follow 3 step commands without difficulty. Cranial Nerves: II: Discs flat bilaterally; Visual fields grossly normal,  III,IV, VI: ptosis not present, extra-ocular motions intact bilaterally, pupils equal, round, reactive to light and accommodation V,VII: smile symmetric, facial light touch sensation normal bilaterally VIII: hearing normal bilaterally IX,X: uvula rises symmetrically XI: bilateral shoulder shrug XII: midline tongue extension Motor: Right : Upper extremity   5/5    Left:     Upper extremity   5/5  Lower extremity   5/5     Lower extremity   5/5 Tone and bulk:normal tone throughout; no atrophy noted Sensory: Pinprick and light touch intact throughout, bilaterally Deep Tendon Reflexes: 2+ and symmetric throughout Plantars: Right: downgoing   Left: downgoing Cerebellar: normal finger-to-nose, normal rapid alternating movements and normal heel-to-shin test Gait: normal gait and station     Lab Results: Basic Metabolic Panel:  Recent Labs Lab  10/17/16 1802 10/17/16 1810  NA 135 139  K 3.8 3.8  CL 105 106  CO2 20*  --   GLUCOSE 155* 156*  BUN 27* 29*  CREATININE 1.16* 1.20*  CALCIUM 9.4  --     CBC:  Recent Labs Lab 10/17/16 1802 10/17/16 1810  WBC 7.0  --   NEUTROABS 4.1  --   HGB 10.3* 10.9*  HCT 31.5* 32.0*  MCV 88.2  --   PLT 325  --     Coagulation Studies:  Recent Labs  10/17/16 1802  LABPROT 13.2  INR 1.01    Imaging: Ct Head Wo Contrast  Result Date: 10/17/2016 CLINICAL DATA:  Pt c/o left arm tingling/ weakness and left sided facial droop onset at about 1600.H/O DM EXAM: CT HEAD WITHOUT CONTRAST TECHNIQUE: Contiguous axial images were obtained from the base of the skull through the vertex without intravenous contrast. COMPARISON:  None. FINDINGS: Brain: No evidence of acute infarction, hemorrhage, hydrocephalus, extra-axial collection or mass lesion/mass effect. Mild periventricular white matter hypoattenuation is noted consistent with chronic microvascular ischemic change. Vascular: No hyperdense vessel or unexpected calcification. Skull: Normal. Negative for fracture or focal lesion. Sinuses/Orbits: Normal globes and orbits. Visualized sinuses and mastoid air cells are clear. Other: None. IMPRESSION: 1. No acute intracranial abnormalities. 2. Mild chronic microvascular ischemic change. Electronically Signed   By: Amie Portland M.D.   On: 10/17/2016 18:41     ASSESSMENT AND PLAN  76 y.o.female white right-handed with past medical history of coronary artery disease  status post stent and diabetes mellitus presents with sudden onset numbness over her left hand and facial droop. Symptoms concerning for TIA. 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 MRA head shows mild atherosclerosis.  Transient ischemic attack, less likely complicated migraine   Recommend # Carotid US #Transthoracic Echo  # Start ASA  daily #Start or continue Atorvastatin 80 mg/other  high intensity statin # BP goal: permissive HTN upto 210 systolic, PRNs above 21 # HBAIC and Lipid profile # Telemetry monitoring # Frequent neuro checks # NPO until passes stroke swallow screen  Please page stroke NP  Or  PA  Or MD from 8am -4 pm  as this patient from this time will be  followed by the stroke.   You can look them up on [email or web address]  Password [facility]   Sushanth Aroor [facility] Pager Number [phone]

## 2016-10-17 NOTE — ED Triage Notes (Addendum)
Pt BIB [facility] from work c.o sudden onset left sided weakness and tingling in her fingertips, coworkers also stated she had a facial droop. Symptoms lasted about 5 mins and had resolved when EMS arrived. Pt denies any pain or other symptoms. No hx of stroke or TIAS, pt does have hx of CAD with 2 stents placed about 6 years ago. Pt recently stopped taking her cholesterol medication (5 days ago) due to leg cramping, primary MD unaware. Pt a.ox4 upon arrival, no neruo deficits noted, VSS, 18GRAC

## 2016-10-17 NOTE — H&P (Signed)
History and Physical    Rachel Thompson ZOX:[id number] DOB: [birth year] DOA: 10/17/2016  PCP: Kandyce Rud, MD   Patient coming from: Home  Chief Complaint: Left arm weakness, left facial droop   HPI: Rachel Thompson is a 76 y.o. female with medical history significant for hyperlipidemia, type 2 diabetes mellitus, coronary artery disease with stents, and hypothyroidism, now presenting to the emergency department for evaluation of left arm weakness and reported facial droop. Patient reports that she was at work today and in her usual state of health when she noted the acute onset of weakness involving the left arm and tingling in the fingertips. A coworker reported that she had a left facial droop, prompting her presentation to the ED this evening. All of her signs and symptoms have resolved by time of arrival in the ED. She reports having a mild headache for the past 4 days, noting that this is not unusual for her. She denies any change in vision or hearing, confusion, loss of coordination, chest pain, or palpitations. She has never experienced similar symptoms previously.  ED Course: Upon arrival to the ED, patient is found to be afebrile, saturating well on room air, and with vitals otherwise stable. EKG features a sinus tachycardia with rate 101 and chronic RBBB, and chronic LAFB. Noncontrast head CT is negative for acute intracranial abnormality, but notable for mild chronic small vessel ischemic disease. Chemistry panel reveals a BUN of 27 and creatinine 1.16, up from 0.8 last month. CBC is notable for a hemoglobin of 10.3, stable from her CBC last month. INR is within the normal limits and troponin is undetectable. Patient was treated with full dose aspirin in the ED. Neurology was consulted and recommended a medical admission for TIA workup. Patient remained hemodynamically stable and in no apparent respiratory distress, and she will be observed on the telemetry unit for ongoing evaluation and  management of left arm weakness and facial droop that has since resolved.  Review of Systems:  All other systems reviewed and apart from HPI, are negative.  Past Medical History:  Diagnosis Date  . Coronary artery disease   . Diabetes mellitus without complication [facility])     Past Surgical History:  Procedure Laterality Date  . CARDIAC SURGERY     cardiac stents     reports that she has never smoked. She has never used smokeless tobacco. She reports that she drinks alcohol. She reports that she does not use drugs.  Allergies  Allergen Reactions  . Sulfa Antibiotics Rash    Family History  Problem Relation Age of Onset  . Breast cancer Mother 32     Prior to Admission medications   Medication Sig Start Date End Date Taking? Authorizing Provider  HYDROcodone-acetaminophen (NORCO/VICODIN) 5-325 MG tablet Take 0.5 tablets by mouth every 4 (four) hours as needed for severe pain. 02/26/16   Sharyn Creamer, MD  oxyCODONE-acetaminophen (ROXICET) 5-325 MG tablet Take 1 tablet by mouth every 6 (six) hours as needed. 12/21/14   Jeanmarie Plant, MD    Physical Exam: Vitals:   10/17/16 1815 10/17/16 1900 10/17/16 1916 10/17/16 1930  BP: (!) 126/57 130/69  (!) 126/59  Pulse: 91 95  92  Resp: (!) 21 (!) 29  20  Temp:   98.4 F (36.9 C)   TempSrc:      SpO2: 96% 97%  97%  Weight:      Height:          Constitutional: NAD, calm, comfortable Eyes: PERTLA, lids  and conjunctivae normal ENMT: Mucous membranes are moist. Posterior pharynx clear of any exudate or lesions.   Neck: normal, supple, no masses, no thyromegaly Respiratory: clear to auscultation bilaterally, no wheezing, no crackles. Normal respiratory effort.   Cardiovascular: S1 & S2 heard, regular rate and rhythm. No extremity edema. No significant JVD. Abdomen: No distension, no tenderness, no masses palpated. Bowel sounds normal.  Musculoskeletal: no clubbing / cyanosis. No joint deformity upper and lower extremities.      Skin: no significant rashes, lesions, ulcers. Warm, dry, well-perfused. Neurologic: CN 2-12 grossly intact. Sensation intact, patellar DTR's normal. Strength 5/5 in all 4 limbs.  Psychiatric: Alert and oriented x 3. Pleasant, cooperative.     Labs on Admission: I have personally reviewed following labs and imaging studies  CBC:  Recent Labs Lab 10/17/16 1802 10/17/16 1810  WBC 7.0  --   NEUTROABS 4.1  --   HGB 10.3* 10.9*  HCT 31.5* 32.0*  MCV 88.2  --   PLT 325  --    Basic Metabolic Panel:  Recent Labs Lab 10/17/16 1802 10/17/16 1810  NA 135 139  K 3.8 3.8  CL 105 106  CO2 20*  --   GLUCOSE 155* 156*  BUN 27* 29*  CREATININE 1.16* 1.20*  CALCIUM 9.4  --    GFR: Estimated Creatinine Clearance: 34.8 mL/min (A) (by C-G formula based on SCr of 1.2 mg/dL (H)). Liver Function Tests:  Recent Labs Lab 10/17/16 1802  AST 23  ALT 17  ALKPHOS 43  BILITOT 0.4  PROT 6.9  ALBUMIN 3.8   No results for input(s): LIPASE, AMYLASE in the last 168 hours. No results for input(s): AMMONIA in the last 168 hours. Coagulation Profile:  Recent Labs Lab 10/17/16 1802  INR 1.01   Cardiac Enzymes: No results for input(s): CKTOTAL, CKMB, CKMBINDEX, TROPONINI in the last 168 hours. BNP (last 3 results) No results for input(s): PROBNP in the last 8760 hours. HbA1C: No results for input(s): HGBA1C in the last 72 hours. CBG: No results for input(s): GLUCAP in the last 168 hours. Lipid Profile: No results for input(s): CHOL, HDL, LDLCALC, TRIG, CHOLHDL, LDLDIRECT in the last 72 hours. Thyroid Function Tests: No results for input(s): TSH, T4TOTAL, FREET4, T3FREE, THYROIDAB in the last 72 hours. Anemia Panel: No results for input(s): VITAMINB12, FOLATE, FERRITIN, TIBC, IRON, RETICCTPCT in the last 72 hours. Urine analysis:    Component Value Date/Time   COLORURINE YELLOW (A) 12/21/2014 1721   APPEARANCEUR CLEAR (A) 12/21/2014 1721   APPEARANCEUR Clear 11/27/2013 2246    LABSPEC 1.011 12/21/2014 1721   LABSPEC 1.048 11/27/2013 2246   PHURINE 6.0 12/21/2014 1721   GLUCOSEU NEGATIVE 12/21/2014 1721   GLUCOSEU 150 mg/dL 40/98/1191 4782   HGBUR NEGATIVE 12/21/2014 1721   BILIRUBINUR NEGATIVE 12/21/2014 1721   BILIRUBINUR Negative 11/27/2013 2246   KETONESUR NEGATIVE 12/21/2014 1721   PROTEINUR NEGATIVE 12/21/2014 1721   NITRITE NEGATIVE 12/21/2014 1721   LEUKOCYTESUR 1+ (A) 12/21/2014 1721   LEUKOCYTESUR 1+ 11/27/2013 2246   Sepsis Labs: (procalcitonin:4,lacticidven:4) )No results found for this or any previous visit (from the past 240 hour(s)).   Radiological Exams on Admission: Ct Head Wo Contrast  Result Date: 10/17/2016 CLINICAL DATA:  Pt c/o left arm tingling/ weakness and left sided facial droop onset at about 1600.H/O DM EXAM: CT HEAD WITHOUT CONTRAST TECHNIQUE: Contiguous axial images were obtained from the base of the skull through the vertex without intravenous contrast. COMPARISON:  None. FINDINGS: Brain: No evidence of  acute infarction, hemorrhage, hydrocephalus, extra-axial collection or mass lesion/mass effect. Mild periventricular white matter hypoattenuation is noted consistent with chronic microvascular ischemic change. Vascular: No hyperdense vessel or unexpected calcification. Skull: Normal. Negative for fracture or focal lesion. Sinuses/Orbits: Normal globes and orbits. Visualized sinuses and mastoid air cells are clear. Other: None. IMPRESSION: 1. No acute intracranial abnormalities. 2. Mild chronic microvascular ischemic change. Electronically Signed   By: Amie Portland M.D.   On: 10/17/2016 18:41    EKG: Independently reviewed. Sinus tachycardia (rate 101), RBBB, LAFB. No significant change from prior.   Assessment/Plan  1. Transient left arm and face weakness  - Pt presents following a transient episode of left arm weakness and paraesthesia, and reportedly had a left facial droop per a coworker  - All signs/symptoms  resolved PTA  - Risk factors for CVA include DM and HLD  - Head CT negative for acute intracranial abnormality, but features mild chronic small-vessel ischemic changes  - Neurology is consulting and much appreciated, will follow-up on recommendations  - Plan to continue cardiac monitoring, frequent neuro checks, risk-factor modification, ASA, PT/OT evals   - Check MRI brain, MRA head (pt will need Ativan to tolerate), echo, carotid dopplers, A1c, lipid panel   2. CAD  - Pt reports hx of 2 stents  - No anginal complaint  - Troponin undetectable, EKG unchanged  - Continue ASA; pt recently stopped her statin d/t leg cramps, plan to check CK and consider encouraging resumption    3. Renal insufficiency, mild - SCr is 1.16 on admission, up from 0.8 in August '18  - Plan to provide a gentle IVF hydration overnight and repeat chemistry panel in am    4. Hyperlipidemia   - Pt recently stopped her statin d/t leg cramps; plan to check CK and consider encourage resumption given the apparent TIA  - Continue Lovaza   5. Type II DM  - A1c was 7.1% in August '18  - Managed at home with Janumet, held on admission  - Check CBG with meals and qHS, start a low-intensity SSI with Novolog   6. Anemia - Hgb is 10.3 on admission, having been 10.2 last month  - No bleeding is evident    7. Hypothyroidism   - TSH normal last month  - Continue Synthroid    DVT prophylaxis: sq Lovenox  Code Status: Full  Family Communication: Discussed with patient Disposition Plan: Observe on telemetry Consults called: Neurology Admission status: Observation    Briscoe Deutscher, MD [facility] Pager [phone]  If 7PM-7AM, please contact night-coverage [email or web address] Password [facility]  10/17/2016, 7:38 PM

## 2016-10-18 ENCOUNTER — Observation Stay (HOSPITAL_BASED_OUTPATIENT_CLINIC_OR_DEPARTMENT_OTHER): Payer: Medicare Other

## 2016-10-18 DIAGNOSIS — E785 Hyperlipidemia, unspecified: Secondary | ICD-10-CM | POA: Diagnosis not present

## 2016-10-18 DIAGNOSIS — E039 Hypothyroidism, unspecified: Secondary | ICD-10-CM | POA: Diagnosis not present

## 2016-10-18 DIAGNOSIS — I5030 Unspecified diastolic (congestive) heart failure: Secondary | ICD-10-CM

## 2016-10-18 DIAGNOSIS — G43109 Migraine with aura, not intractable, without status migrainosus: Secondary | ICD-10-CM

## 2016-10-18 DIAGNOSIS — G459 Transient cerebral ischemic attack, unspecified: Secondary | ICD-10-CM | POA: Diagnosis not present

## 2016-10-18 DIAGNOSIS — R29898 Other symptoms and signs involving the musculoskeletal system: Secondary | ICD-10-CM | POA: Diagnosis not present

## 2016-10-18 DIAGNOSIS — I2583 Coronary atherosclerosis due to lipid rich plaque: Secondary | ICD-10-CM | POA: Diagnosis not present

## 2016-10-18 DIAGNOSIS — I2510 Atherosclerotic heart disease of native coronary artery without angina pectoris: Secondary | ICD-10-CM | POA: Diagnosis not present

## 2016-10-18 LAB — HEMOGLOBIN A1C
HEMOGLOBIN A1C: 6.7 % — AB (ref 4.8–5.6)
Mean Plasma Glucose: 145.59 mg/dL

## 2016-10-18 LAB — GLUCOSE, CAPILLARY
GLUCOSE-CAPILLARY: 122 mg/dL — AB (ref 65–99)
GLUCOSE-CAPILLARY: 120 mg/dL — AB (ref 65–99)

## 2016-10-18 LAB — LIPID PANEL
Cholesterol: 258 mg/dL — ABNORMAL HIGH (ref 0–200)
HDL: 46 mg/dL (ref >40)
LDL CALC: 194 mg/dL — AB (ref 0–99)
Total CHOL/HDL Ratio: 5.6 RATIO
Triglycerides: 90 mg/dL (ref <150)
VLDL: 18 mg/dL (ref 0–40)

## 2016-10-18 LAB — ECHOCARDIOGRAM COMPLETE
Height: 61 in
Weight: 2416 oz

## 2016-10-18 MED ORDER — ASPIRIN 325 MG PO TABS: 325.0000 mg | ORAL_TABLET | Freq: Every day | ORAL | 3 refills | Status: AC

## 2016-10-18 MED ORDER — EZETIMIBE 10 MG PO TABS: 10.0000 mg | ORAL_TABLET | Freq: Every day | ORAL | Status: DC

## 2016-10-18 MED ORDER — EZETIMIBE 10 MG PO TABS: 10.0000 mg | ORAL_TABLET | Freq: Every day | ORAL | 3 refills | Status: AC

## 2016-10-18 MED ORDER — ATORVASTATIN CALCIUM 80 MG PO TABS: 80.0000 mg | ORAL_TABLET | Freq: Every day | ORAL | Status: DC

## 2016-10-18 NOTE — Progress Notes (Signed)
  Echocardiogram 2D Echocardiogram has been performed.  Rachel Thompson 10/18/2016, 10:31 AM

## 2016-10-18 NOTE — Evaluation (Signed)
Physical Therapy Evaluation & Discharge Patient Details Name: Rachel Thompson MRN: [id number] DOB: May 08, 1940 Today's Date: 10/18/2016   History of Present Illness  Pt is a 76 y/o female admitted secondary to L UE numbness and facial droop. MRI was negative. PMH including but not limited to DM and CAD.  Clinical Impression  Pt presented supine in bed with HOB elevated, awake and willing to participate in therapy session. Prior to admission, pt reported that she was independent with all functional mobility and ADLs. 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 Pt ambulated in hallway without use of an AD with supervision for safety. Pt also successfully completed stair training with no concerns. Pt stated that she feels she is back to her baseline level of functioning. No further acute PT needs identified at this time. PT signing off.     Follow Up Recommendations No PT follow up    Equipment Recommendations  None recommended by PT    Recommendations for Other Services       Precautions / Restrictions Precautions Precautions: None Restrictions Weight Bearing Restrictions: No      Mobility  Bed Mobility Overal bed mobility: Modified Independent                Transfers Overall transfer level: Needs assistance Equipment used: None Transfers: Sit to/from Stand Sit to Stand: Supervision         General transfer comment: supervision for safety with pt reporting dizziness that quickly resolved  Ambulation/Gait Ambulation/Gait assistance: Supervision Ambulation Distance (Feet): 200 Feet Assistive device: None Gait Pattern/deviations: Step-through pattern;Decreased stride length Gait velocity: decreased Gait velocity interpretation: Below normal speed for age/gender General Gait Details: steady, cautious gait but able to vary speed with cueing. No instability, LOB or need for physical assistance, supervision for  safety  Stairs Stairs: Yes Stairs assistance: Supervision Stair Management: Two rails;Alternating pattern;Forwards Number of Stairs: 2 General stair comments: no issues or concerns  Wheelchair Mobility    Modified Rankin (Stroke Patients Only)       Balance Overall balance assessment: Needs assistance Sitting-balance support: Feet supported Sitting balance-Leahy Scale: Normal     Standing balance support: During functional activity;No upper extremity supported Standing balance-Leahy Scale: Good Standing balance comment: pt able to stand at sink to perform ADL task without UE supports                             Pertinent Vitals/Pain Pain Assessment: No/denies pain    Home Living Family/patient expects to be discharged to:: Private residence Living Arrangements: Spouse/significant other Available Help at Discharge: Family;Available PRN/intermittently Type of Home: House Home Access: Level entry     Home Layout: One level Home Equipment: None      Prior Function Level of Independence: Independent         Comments: 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 [employer]   Dominant Hand: Right    Extremity/Trunk Assessment   Upper Extremity Assessment Upper Extremity Assessment: Defer to OT evaluation    Lower Extremity Assessment Lower Extremity Assessment: Overall WFL for tasks assessed       Communication   Communication: No difficulties  Cognition Arousal/Alertness: Awake/alert Behavior During Therapy: WFL for tasks assessed/performed Overall Cognitive Status: Within Functional Limits for tasks assessed  General Comments      Exercises     Assessment/Plan    PT Assessment Patent does not need any further PT services  PT Problem List         PT Treatment Interventions      PT Goals (Current goals can be found in the Care Plan section)  Acute  Rehab PT Goals Patient Stated Goal: return home and back to work    Frequency     Barriers to discharge        Co-evaluation               AM-PAC PT "6 Clicks" Daily Activity  Outcome Measure Difficulty turning over in bed (including adjusting bedclothes, sheets and blankets)?: None Difficulty moving from lying on back to sitting on the side of the bed? : None Difficulty sitting down on and standing up from a chair with arms (e.g., wheelchair, bedside commode, etc,.)?: None Help needed moving to and from a bed to chair (including a wheelchair)?: None Help needed walking in hospital room?: None Help needed climbing 3-5 steps with a railing? : None 6 Click Score: 24    End of Session   Activity Tolerance: Patient tolerated treatment well Patient left: in bed;with call bell/phone within reach Nurse Communication: Mobility status PT Visit Diagnosis: Other symptoms and signs involving the nervous system (R29.898)    Time: 1308-6578 PT Time Calculation (min) (ACUTE ONLY): 23 min   Charges:   PT Evaluation $PT Eval Moderate Complexity: 1 Mod PT Treatments $Gait Training: 8-22 mins   PT G Codes:   PT G-Codes **NOT FOR INPATIENT CLASS** Functional Assessment Tool Used: AM-PAC 6 Clicks Basic Mobility;Clinical judgement Functional Limitation: Mobility: Walking and moving around Mobility: Walking and Moving Around Current Status (I6962): 0 percent impaired, limited or restricted Mobility: Walking and Moving Around Goal Status (X5284): 0 percent impaired, limited or restricted Mobility: Walking and Moving Around Discharge Status (X3244): 0 percent impaired, limited or restricted    [facility], PT, DPT [phone]   Alessandra Bevels Hila Bolding 10/18/2016, 9:54 AM

## 2016-10-18 NOTE — Progress Notes (Signed)
STROKE TEAM PROGRESS NOTE   HISTORY OF PRESENT ILLNESS (per record) Rachel Thompson is an 76 y.o. female white right-handed with past medical history of coronary artery disease status post stent, diabetes mellitus, and frequent mild headaches who presented with sudden onset numbness over her left hand and left-sided facial droop.  According to patient she was at work today at 4:15 PM when she noticed her left hand become numb and tingling which gradually extended to the wrist. Her supervisor also noticed she had mild left facial droop. Her symptoms lasted for about 5-10 minutes and her symptoms resolved with the time she arrived to the emergency department. He denies slurring of speech, difficulty getting words out, loss of vision or weakness. The patient does have history migraines as a teenager and states that she had been having a headache for the last 4 days which is unusual for her. Her headache is resolved after being in the hospital. Patient was admitted for TIA workup and neurology was consult to for evaluation.  Pt reports failing atorvastatin, rosuvastatin, and pravastatin, due to severe leg myalgias.  Pt recently took herself off of pravastatin due to myalgias.  Date last known well:  9.26.18 Time last known well: 4.15 pm NIHSS 0 ( 1 by nurse for mild left facial droop) Modified Rankin: 0  Patient was not administered IV t-PA secondary to resolution of symptoms on arrival. She was admitted to General Neurology for further evaluation and treatment.   SUBJECTIVE (INTERVAL HISTORY) Her husband is at the bedside.  The patient is awake, alert, and follows all commands appropriately.  ASA increased from  to 325 mg PO daily. She had HA for 4 days PTA nagging achy pain. He had then 5-10 min of left fingers numbness and up to wrist. MRI negative. She is intolerant of statins. LDL 194.    OBJECTIVE Temp:  [97.8 F (36.6 C)-98.4 F (36.9 C)] 97.8 F (36.6 C) (09/27 0650) Pulse Rate:  [78-100]  82 (09/27 0650) Resp:  [16-29] 16 (09/27 0650) BP: (106-157)/(57-80) 136/72 (09/27 0650) SpO2:  [95 %-99 %] 95 % (09/27 0650) Weight:  [68.5 kg (151 lb)] 68.5 kg (151 lb) (09/26 1755)  CBC:  Recent Labs Lab 10/17/16 1802 10/17/16 1810  WBC 7.0  --   NEUTROABS 4.1  --   HGB 10.3* 10.9*  HCT 31.5* 32.0*  MCV 88.2  --   PLT 325  --     Basic Metabolic Panel:  Recent Labs Lab 10/17/16 1802 10/17/16 1810  NA 135 139  K 3.8 3.8  CL 105 106  CO2 20*  --   GLUCOSE 155* 156*  BUN 27* 29*  CREATININE 1.16* 1.20*  CALCIUM 9.4  --     Lipid Panel:    Component Value Date/Time   CHOL 258 (H) 10/18/2016 0550   TRIG 90 10/18/2016 0550   HDL 46 10/18/2016 0550   CHOLHDL 5.6 10/18/2016 0550   VLDL 18 10/18/2016 0550   LDLCALC 194 (H) 10/18/2016 0550   HgbA1c:  Lab Results  Component Value Date   HGBA1C 6.7 (H) 10/18/2016   Urine Drug Screen: No results found for: LABOPIA, COCAINSCRNUR, LABBENZ, AMPHETMU, THCU, LABBARB  Alcohol Level No results found for: [facility]  IMAGING I have personally reviewed the radiological images below and agree with the radiology interpretations.  Ct Head Wo Contrast 10/17/2016 IMPRESSION: 1. No acute intracranial abnormalities. 2. Mild chronic microvascular ischemic change.   Mr Brain 87 Contrast Mr Shirlee Latch Wo Contrast 10/17/2016 IMPRESSION:  MRI head: 1. No acute intracranial abnormality identified. 2. Mild for age chronic microvascular ischemic changes and mild parenchymal volume loss of the brain. 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 MRA head: 1. Patent circle of Willis. No large vessel occlusion, aneurysm, or significant stenosis is identified. 2. Intracranial atherosclerosis with mild non stenotic irregularity of carotid siphons and segments of mild-to-moderate stenosis in the proximal posterior cerebral arteries.    TTE 10/19/2016 Study Conclusions - Left ventricle: The cavity size was normal. Wall thickness was normal. The  estimated ejection fraction was 55%. Although no diagnostic regional wall motion abnormality was identified, this possibility cannot be completely excluded on the basis of this   study. Doppler parameters are consistent with abnormal left ventricular relaxation (grade 1 diastolic dysfunction). - Aortic valve: Trileaflet; moderately calcified leaflets. There was no stenosis. There was trivial regurgitation. - Mitral valve: Mildly calcified annulus. Mildly calcified leaflets. There was trivial regurgitation.   - Right ventricle: The cavity size was normal. Systolic function was normal. - Tricuspid valve: Peak RV-RA gradient (S): 21 mm Hg. - Pulmonary arteries: PA peak pressure: 24 mm Hg (S). - Inferior vena cava: The vessel was normal in size. The respirophasic diameter changes were in the normal range (>= 50%), consistent with normal central venous pressure. Impressions: - Normal LV size with EF 55%. Normal RV size and systolic function.  No significant valvular abnormalities.  Carotid US 10/19/2016  1-39% ICA stenosis. Vertebral artery flow is antegrade.    PHYSICAL EXAM  Temp:  [97.8 F (36.6 C)-98.4 F (36.9 C)] 98.4 F (36.9 C) (09/27 1427) Pulse Rate:  [78-100] 88 (09/27 1427) Resp:  [16-18] 17 (09/27 1427) BP: (106-157)/(61-80) 135/74 (09/27 1427) SpO2:  [95 %-99 %] 97 % (09/27 1427)  General - Well nourished, well developed, in no apparent distress.  Ophthalmologic - Sharp disc margins OU.   Cardiovascular - Regular rate and rhythm.  Mental Status -  Level of arousal and orientation to time, place, and person were intact. Language including expression, naming, repetition, comprehension was assessed and found intact. Fund of Knowledge was assessed and was intact.  Cranial Nerves II - XII - II - Visual field intact OU. III, IV, VI - Extraocular movements intact. V - Facial sensation intact bilaterally. VII - Facial movement intact bilaterally. VIII - Hearing &  vestibular intact bilaterally. X - Palate elevates symmetrically. XI - Chin turning & shoulder shrug intact bilaterally. XII - Tongue protrusion intact.  Motor Strength - The patient's strength was normal in all extremities and pronator drift was absent.  Bulk was normal and fasciculations were absent.   Motor Tone - Muscle tone was assessed at the neck and appendages and was normal.  Reflexes - The patient's reflexes were 1+ in all extremities and she had no pathological reflexes.  Sensory - Light touch, temperature/pinprick, vibration and proprioception were assessed and were symmetrical.    Coordination - The patient had normal movements in the hands and feet with no ataxia or dysmetria.  Tremor was absent.  Gait and Station - deferred   ASSESSMENT/PLAN Ms. Abrea Henle is a 76 y.o. female with history of  presenting with coronary artery disease  status post stent, diabetes mellitus, and frequent mild headaches who presented with sudden onset numbness over her left hand and left-sided facial droop.  She did not receive IV t-PA due to arriving outside of the tPA treatment window.  Complicated migraine vs. Anxiety - TIA less likely  Resultant  No neuro  deficit  CT head: no acute stroke  MRI head: no acute stroke  MRA head: no LVO or high-grade stenosis  Carotid Doppler: B ICA 1-39% stenosis, VAs antegrade  2D Echo: EF 55%. No source of embolus  LDL 194  HgbA1c 6.7  Lovenox 40 mg sq daily for VTE prophylaxis  Diet heart healthy/carb modified Room service appropriate? Yes; Fluid consistency: Thin  aspirin 81 mg daily prior to admission, now on aspirin 325 mg daily. Continue ASA 325 on discharge.   Patient counseled to be compliant with her antithrombotic medications  Ongoing aggressive stroke risk factor management  Therapy recommendations: None  Disposition: home  Hypertension  Stable  Long-term BP goal normotensive  Hyperlipidemia  Home meds: pravastatin 10  mg PO daily  LDL 194, goal < 70  Pt intolerance to statins - has tried several in the past  Recommend to discuss with PCP for PCSK9 inhibitors.  Diabetes  HgbA1c 6.7, goal < 7.0  Controlled  SSI  Other Stroke Risk Factors  Advanced age  ETOH use, advised to drink no more than 1 drink(s) a day  CAD s/p stent  Other Active Problems  None  Hospital day # 0  Neurology will sign off. Please call with questions. No neuro follow up needed. Thanks for the consult.  Marvel Plan, MD PhD Stroke Neurology 10/18/2016 9:48 PM     To contact Stroke Continuity provider, please refer to [email or web address]. After hours, contact General Neurology

## 2016-10-18 NOTE — Progress Notes (Signed)
Patient discharged home with spouse. Discharge instructions given. IV and telemetry discontinued. Patient questions asked and answered. Patient will transport from unit with wheelchair and staff. Lawson Radar

## 2016-10-18 NOTE — Progress Notes (Signed)
[facility]                                                                              Patient Demographics  Rachel Thompson, is a 76 y.o. female, DOB - [birth year], ZOX:[id number]  Admit date - 10/17/2016   Admitting Physician Briscoe Deutscher, MD  Outpatient Primary MD for the patient is Kandyce Rud, MD  Outpatient specialists:   LOS - 0  days   Medical records reviewed and are as summarized below:    Chief Complaint  Patient presents with  . Transient Ischemic Attack       Brief summary   Plan admit note by Dr. Antionette Char 9/26 Rachel Thompson is a 76 y.o. female with medical history significant for hyperlipidemia, type 2 diabetes mellitus, coronary artery disease with stents, and hypothyroidism, now presenting to the emergency department for evaluation of left arm weakness and reported facial droop. Patient reports that she was at work today and in her usual state of health when she noted the acute onset of weakness involving the left arm and tingling in the fingertips. A coworker reported that she had a left facial droop, prompting her presentation to the ED.  CT head was negative for any stroke, patient was admitted for TIA workup.     Assessment & Plan    Principal Problem:   TIA (transient ischemic attack) resented with left arm and face weakness, paresthesias, left facial droop - Symptoms currently resolved - CT head negative for acute intracranial abnormality - MRI brain showed no acute intracranial abnormalities, mild for each chronic microvascular ischemic changes. Small chronic lacunar infarct in the right caudate head. - MRA head showed patent circle of Willis, no large vessel occlusion, aneurysm or significant stenosis - carotid Dopplers showed 1-39% ICA stenosis - continue aspirin 325 mg daily -lipid panel showed LDL 194, patient reports intolerance to statins, has not been able to tolerate crestor, Lipitor, most recent Pravachol, she was on 10 mg  Pravachol and discontinued it 5 days ago due to leg cramps. Will defer to neurology if zetia can be started  Active Problems:   Coronary artery disease - urrently stable, no chest pain or shortness of breath, 2-D echo pending     HLD (hyperlipidemia) - lipid panel showed LDL 194, patient reports intolerance to statins, has not been able to tolerate crestor, Lipitor, most recent Pravachol, she was on 10 mg Pravachol and discontinued it 5 days ago due to leg cramps. Will defer to neurology if zetia can be started     Anxiety disorder - stable continue Celexa    Renal insufficiency/Chronic kidney disease stage II - Creatinine 1.2, close to baseline    Normocytic anemia - Baseline appears to be 11, currently 10.9    Hypothyroidism - continue Synthroid  Code Status: full code  DVT Prophylaxis:  Lovenox Family Communication: Discussed in detail with the patient, all imaging results, lab results explained to the patient   Disposition Plan: awaiting 2-D echo results, PT  Time Spent in minutes   25 minutes  Procedures:  MRI , MRA  Consultants:   neurology  Antimicrobials:  Medications  Scheduled Meds: . aspirin  300 mg Rectal Daily   Or  . aspirin  325 mg Oral Daily  . atorvastatin  80 mg Oral q1800  . citalopram  10 mg Oral Daily  . enoxaparin (LOVENOX) injection  40 mg Subcutaneous Q24H  . insulin aspart  0-5 Units Subcutaneous QHS  . insulin aspart  0-9 Units Subcutaneous TID WC  . levothyroxine  100 mcg Oral QAC breakfast  . omega-3 acid ethyl esters  1 g Oral BID  . pantoprazole  40 mg Oral Daily   Continuous Infusions: PRN Meds:.acetaminophen **OR** acetaminophen (TYLENOL) oral liquid 160 mg/5 mL **OR** acetaminophen, HYDROcodone-acetaminophen, LORazepam, senna-docusate   Antibiotics   Anti-infectives    None        Subjective:   Rachel Thompson was seen and examined today.   Patient denies dizziness, chest pain, shortness of breath, abdominal  pain, N/V/D/C, new weakness, numbess, tingling. No acute events overnight.  Since left-sided weakness has improved.  Objective:   Vitals:   10/18/16 0300 10/18/16 0500 10/18/16 0650 10/18/16 1427  BP: (!) 157/80 106/66 136/72 135/74  Pulse: 83 100 82 88  Resp: Temp: 98.1 F (36.7 C) 98.3 F (36.8 C) 97.8 F (36.6 C) 98.4 F (36.9 C)  TempSrc: Oral Oral Oral Oral  SpO2: 96% 96% 95% 97%  Weight:      Height:        Intake/Output Summary (Last 24 hours) at 10/18/16 1432 Last data filed at 10/18/16 1152  Gross per 24 hour  Intake              100 ml  Output              200 ml  Net             -100 ml     Wt Readings from Last 3 Encounters:  10/17/16 68.5 kg (151 lb)  02/26/16 68 kg (150 lb)  12/21/14 72.6 kg (160 lb)     Exam  General: Alert and oriented x 3, NAD  Eyes:   HEENT:  Atraumatic, normocephalic  Cardiovascular: S1 S2 auscultated, no rubs, murmurs or gallops. Regular rate and rhythm.  Respiratory: Clear to auscultation bilaterally, no wheezing, rales or rhonchi  Gastrointestinal: Soft, nontender, nondistended, + bowel sounds  Ext: no pedal edema bilaterally  Neuro: AAOx3, Cr N's II- XII. Strength 5/5 upper and lower extremities bilaterally, speech clear  Musculoskeletal: No digital cyanosis, clubbing  Skin: No rashes  Psych: Normal affect and demeanor, alert and oriented x3    Data Reviewed:  I have personally reviewed following labs and imaging studies  Micro Results No results found for this or any previous visit (from the past 240 hour(s)).  Radiology Reports Ct Head Wo Contrast  Result Date: 10/17/2016 CLINICAL DATA:  Pt c/o left arm tingling/ weakness and left sided facial droop onset at about 1600.H/O DM EXAM: CT HEAD WITHOUT CONTRAST TECHNIQUE: Contiguous axial images were obtained from the base of the skull through the vertex without intravenous contrast. COMPARISON:  None. FINDINGS: Brain: No evidence of acute  infarction, hemorrhage, hydrocephalus, extra-axial collection or mass lesion/mass effect. Mild periventricular white matter hypoattenuation is noted consistent with chronic microvascular ischemic change. Vascular: No hyperdense vessel or unexpected calcification. Skull: Normal. Negative for fracture or focal lesion. Sinuses/Orbits: Normal globes and orbits. Visualized sinuses and mastoid air cells are clear. Other: None. IMPRESSION: 1. No acute intracranial abnormalities. 2. Mild chronic microvascular ischemic change.  Electronically Signed   By: Amie Portland M.D.   On: 10/17/2016 18:41   Mr Brain Wo Contrast  Result Date: 10/17/2016 CLINICAL DATA:  76 y/o  F; TIA, initial examination. EXAM: MRI HEAD WITHOUT CONTRAST MRA HEAD WITHOUT CONTRAST TECHNIQUE: Multiplanar, multiecho pulse sequences of the brain and surrounding structures were obtained without intravenous contrast. Angiographic images of the head were obtained using MRA technique without contrast. COMPARISON:  10/17/2016 CT of the head FINDINGS: MRI HEAD FINDINGS Brain: No acute infarction, hemorrhage, hydrocephalus, extra-axial collection or mass lesion. Several nonspecific foci of T2 FLAIR hyperintense signal abnormality in subcortical and periventricular white matter are compatible with mild chronic microvascular ischemic changes for age. Mild brain parenchymal volume loss. Small chronic lacunar infarct within the right caudate head. Vascular: As below. Skull and upper cervical spine: Normal marrow signal. Sinuses/Orbits: Negative. Other: None. MRA HEAD FINDINGS Internal carotid arteries: Patent. Mild irregularity of carotid siphons without significant stenosis compatible with atherosclerosis. Anterior cerebral arteries:  Patent. Middle cerebral arteries: Patent. Anterior communicating artery: Patent. Posterior communicating arteries:  Patent. Posterior cerebral arteries: Patent. Left P1 short segment of mild stenosis and right P2 short segment of  moderate stenosis. Basilar artery:  Patent. Vertebral arteries:  Patent. No evidence of high-grade stenosis, large vessel occlusion, or aneurysm unless noted above. IMPRESSION: MRI head: 1. No acute intracranial abnormality identified. 2. Mild for age chronic microvascular ischemic changes and mild parenchymal volume loss of the brain. 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 MRA head: 1. Patent circle of Willis. No large vessel occlusion, aneurysm, or significant stenosis is identified. 2. Intracranial atherosclerosis with mild non stenotic irregularity of carotid siphons and segments of mild-to-moderate stenosis in the proximal posterior cerebral arteries. Electronically Signed   By: Mitzi Hansen M.D.   On: 10/17/2016 23:22   Mr Maxine Glenn Head Wo Contrast  Result Date: 10/17/2016 CLINICAL DATA:  76 y/o  F; TIA, initial examination. EXAM: MRI HEAD WITHOUT CONTRAST MRA HEAD WITHOUT CONTRAST TECHNIQUE: Multiplanar, multiecho pulse sequences of the brain and surrounding structures were obtained without intravenous contrast. Angiographic images of the head were obtained using MRA technique without contrast. COMPARISON:  10/17/2016 CT of the head FINDINGS: MRI HEAD FINDINGS Brain: No acute infarction, hemorrhage, hydrocephalus, extra-axial collection or mass lesion. Several nonspecific foci of T2 FLAIR hyperintense signal abnormality in subcortical and periventricular white matter are compatible with mild chronic microvascular ischemic changes for age. Mild brain parenchymal volume loss. Small chronic lacunar infarct within the right caudate head. Vascular: As below. Skull and upper cervical spine: Normal marrow signal. Sinuses/Orbits: Negative. Other: None. MRA HEAD FINDINGS Internal carotid arteries: Patent. Mild irregularity of carotid siphons without significant stenosis compatible with atherosclerosis. Anterior cerebral arteries:  Patent. Middle cerebral arteries: Patent. Anterior  communicating artery: Patent. Posterior communicating arteries:  Patent. Posterior cerebral arteries: Patent. Left P1 short segment of mild stenosis and right P2 short segment of moderate stenosis. Basilar artery:  Patent. Vertebral arteries:  Patent. No evidence of high-grade stenosis, large vessel occlusion, or aneurysm unless noted above. IMPRESSION: MRI head: 1. No acute intracranial abnormality identified. 2. Mild for age chronic microvascular ischemic changes and mild parenchymal volume loss of the brain. 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 MRA head: 1. Patent circle of Willis. No large vessel occlusion, aneurysm, or significant stenosis is identified. 2. Intracranial atherosclerosis with mild non stenotic irregularity of carotid siphons and segments of mild-to-moderate stenosis in the proximal posterior cerebral arteries. Electronically Signed   By: Micah Noel  [place] M.D.   On: 10/17/2016 23:22    Lab Data:  CBC:  Recent Labs Lab 10/17/16 1802 10/17/16 1810  WBC 7.0  --   NEUTROABS 4.1  --   HGB 10.3* 10.9*  HCT 31.5* 32.0*  MCV 88.2  --   PLT 325  --    Basic Metabolic Panel:  Recent Labs Lab 10/17/16 1802 10/17/16 1810  NA 135 139  K 3.8 3.8  CL 105 106  CO2 20*  --   GLUCOSE 155* 156*  BUN 27* 29*  CREATININE 1.16* 1.20*  CALCIUM 9.4  --    GFR: Estimated Creatinine Clearance: 34.8 mL/min (A) (by C-G formula based on SCr of 1.2 mg/dL (H)). Liver Function Tests:  Recent Labs Lab 10/17/16 1802  AST 23  ALT 17  ALKPHOS 43  BILITOT 0.4  PROT 6.9  ALBUMIN 3.8   No results for input(s): LIPASE, AMYLASE in the last 168 hours. No results for input(s): AMMONIA in the last 168 hours. Coagulation Profile:  Recent Labs Lab 10/17/16 1802  INR 1.01   Cardiac Enzymes:  Recent Labs Lab 10/17/16 1802  CKTOTAL 31*   BNP (last 3 results) No results for input(s): PROBNP in the last 8760 hours. HbA1C:  Recent Labs  10/18/16 0550    HGBA1C 6.7*   CBG:  Recent Labs Lab 10/17/16 2307 10/18/16 0607 10/18/16 1148  GLUCAP 108* 122* 120*   Lipid Profile:  Recent Labs  10/18/16 0550  CHOL 258*  HDL 46  LDLCALC 194*  TRIG 90  CHOLHDL 5.6   Thyroid Function Tests: No results for input(s): TSH, T4TOTAL, FREET4, T3FREE, THYROIDAB in the last 72 hours. Anemia Panel: No results for input(s): VITAMINB12, FOLATE, FERRITIN, TIBC, IRON, RETICCTPCT in the last 72 hours. Urine analysis:    Component Value Date/Time   COLORURINE YELLOW (A) 12/21/2014 1721   APPEARANCEUR CLEAR (A) 12/21/2014 1721   APPEARANCEUR Clear 11/27/2013 2246   LABSPEC 1.011 12/21/2014 1721   LABSPEC 1.048 11/27/2013 2246   PHURINE 6.0 12/21/2014 1721   GLUCOSEU NEGATIVE 12/21/2014 1721   GLUCOSEU 150 mg/dL 16/10/9602 5409   HGBUR NEGATIVE 12/21/2014 1721   BILIRUBINUR NEGATIVE 12/21/2014 1721   BILIRUBINUR Negative 11/27/2013 2246   KETONESUR NEGATIVE 12/21/2014 1721   PROTEINUR NEGATIVE 12/21/2014 1721   NITRITE NEGATIVE 12/21/2014 1721   LEUKOCYTESUR 1+ (A) 12/21/2014 1721   LEUKOCYTESUR 1+ 11/27/2013 2246     Ripudeep Rai M.D. [facility] 10/18/2016, 2:32 PM  Pager: [phone] Between 7am to 7pm - call Pager - [phone]  After 7pm go to [email or web address] - password [email or web address]  Call night coverage person covering after 7pm

## 2016-10-18 NOTE — Progress Notes (Signed)
VASCULAR LAB PRELIMINARY  PRELIMINARY  PRELIMINARY  PRELIMINARY  Carotid duplex completed.    Preliminary report:  1-39% ICA stenosis. Vertebral artery flow is antegrade.   Jourdan Durbin, RVT 10/18/2016, 10:55 AM

## 2016-10-18 NOTE — Progress Notes (Signed)
Occupational Therapy Evaluation Patient Details Name: Rachel Thompson MRN: [id number] DOB: 1940-07-07 Today's Date: 10/18/2016    History of Present Illness Pt is a 76 y/o female admitted secondary to L UE numbness and facial droop. MRI was negative. PMH including but not limited to DM and CAD.   Clinical Impression   Pt appears at baseline level of function. Pt expressing concerns regarding her husband. States she is very "stressed" with the situation(feels her husband has dementia and not sure what to do). OT signing off at this time.     Follow Up Recommendations  No OT follow up;Supervision - Intermittent    Equipment Recommendations  None recommended by OT    Recommendations for Other Services       Precautions / Restrictions Precautions Precautions: None Restrictions Weight Bearing Restrictions: No      Mobility Bed Mobility Overal bed mobility: Modified Independent                Transfers Overall transfer level: Modified independent Equipment used: None Transfers: Sit to/from Stand Sit to Stand: Supervision         General transfer comment: supervision for safety with pt reporting dizziness that quickly resolved    Balance Overall balance assessment: No apparent balance deficits (not formally assessed) Sitting-balance support: Feet supported Sitting balance-Leahy Scale: Normal     Standing balance support: During functional activity;No upper extremity supported Standing balance-Leahy Scale: Good Standing balance comment: pt able to stand at sink to perform ADL task without UE supports                           ADL either performed or assessed with clinical judgement   ADL Overall ADL's : At baseline                                             Vision Baseline Vision/History: No visual deficits       Perception     Praxis      Pertinent Vitals/Pain Pain Assessment: No/denies pain     Hand Dominance  Right   Extremity/Trunk Assessment Upper Extremity Assessment Upper Extremity Assessment: Overall WFL for tasks assessed (minimal weakness L hand)   Lower Extremity Assessment Lower Extremity Assessment: Defer to PT evaluation   Cervical / Trunk Assessment Cervical / Trunk Assessment: Normal   Communication Communication Communication: No difficulties   Cognition Arousal/Alertness: Awake/alert Behavior During Therapy: WFL for tasks assessed/performed Overall Cognitive Status: Within Functional Limits for tasks assessed                                     General Comments       Exercises     Shoulder Instructions      Home Living Family/patient expects to be discharged to:: Private residence Living Arrangements: Spouse/significant other Available Help at Discharge: Family;Available PRN/intermittently Type of Home: House Home Access: Level entry     Home Layout: One level     Bathroom Shower/Tub: Tub/shower unit;Walk-in shower   Bathroom Toilet: Standard Bathroom Accessibility: Yes How Accessible: Accessible via walker Home Equipment: None          Prior Functioning/Environment Level of Independence: Independent        Comments: 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 [employer]        OT Problem List: Decreased strength      OT Treatment/Interventions:      OT Goals(Current goals can be found in the care plan section) Acute Rehab OT Goals Patient Stated Goal: return home and back to work OT Goal Formulation: All assessment and education complete, DC therapy  OT Frequency:     Barriers to D/C:            Co-evaluation              AM-PAC PT "6 Clicks" Daily Activity     Outcome Measure Help from another person eating meals?: None Help from another person taking care of personal grooming?: None Help from another person toileting, which includes using toliet, bedpan, or urinal?: None Help from another person  bathing (including washing, rinsing, drying)?: None Help from another person to put on and taking off regular upper body clothing?: None Help from another person to put on and taking off regular lower body clothing?: None 6 Click Score: 24   End of Session Nurse Communication: Mobility status;Other (comment) (Pt concerned about husband not being here)  Activity Tolerance: Patient tolerated treatment well Patient left: in bed;with call bell/phone within reach  OT Visit Diagnosis: Unsteadiness on feet (R26.81);Muscle weakness (generalized) (M62.81)                Time: 1610-9604 OT Time Calculation (min): 22 min Charges:  OT General Charges $OT Visit: 1 Visit OT Evaluation $OT Eval Low Complexity: 1 Low G-Codes: OT G-codes **NOT FOR INPATIENT CLASS** Functional Assessment Tool Used: Clinical judgement Functional Limitation: Self care Self Care Current Status (V4098): 0 percent impaired, limited or restricted Self Care Goal Status (J1914): 0 percent impaired, limited or restricted Self Care Discharge Status (N8295): 0 percent impaired, limited or restricted   [facility], OT/L  [phone] 10/18/2016  Brasen Bundren,HILLARY 10/18/2016, 11:56 AM

## 2016-10-18 NOTE — Discharge Summary (Signed)
Physician Discharge Summary   Patient ID: Yusra Ravert MRN: [id number] DOB/AGE: Jun 06, 1940 76 y.o.  Admit date: 10/17/2016 Discharge date: 10/18/2016  Primary Care Physician:  Kandyce Rud, MD  Discharge Diagnoses:    . Coronary artery disease . Left arm weakness . TIA (transient ischemic attack) . HLD (hyperlipidemia) . Anxiety disorder . Normocytic anemia . Hypothyroidism   Consults: neurology  Recommendations for Outpatient Follow-up:  1. Please repeat CBC/BMET at next visit 2. Patient has intolerance to statins, please consider referral to [facility] cardiology lipid clinic for consideration of PCSK9 inhibitors   DIET: Carb modified diet    Allergies:   Allergies  Allergen Reactions  . Sulfa Antibiotics Rash     DISCHARGE MEDICATIONS: Current Discharge Medication List    START taking these medications   Details  aspirin 325 MG tablet Take 1 tablet (325 mg total) by mouth daily. Qty: 30 tablet, Refills: 3    ezetimibe (ZETIA) 10 MG tablet Take 1 tablet (10 mg total) by mouth daily. Qty: 30 tablet, Refills: 3      CONTINUE these medications which have NOT CHANGED   Details  citalopram (CELEXA) 10 MG tablet Take 10 mg by mouth daily.    esomeprazole (NEXIUM 24HR) 20 MG capsule Take 20 mg by mouth daily at 12 noon.    levothyroxine (SYNTHROID, LEVOTHROID) 100 MCG tablet Take 100 mcg by mouth daily.    Multiple Vitamin (MULTI-VITAMINS) TABS Take 1 tablet by mouth daily.    mupirocin ointment (BACTROBAN) 2 % Apply 1 application topically as needed.    Omega-3 1000 MG CAPS Take 1,000 mcg by mouth daily.    pravastatin (PRAVACHOL) 10 MG tablet Take 10 mg by mouth daily.    sitaGLIPtin-metformin (JANUMET) 50-1000 MG tablet Take 1 tablet by mouth 2 (two) times daily with a meal.       STOP taking these medications     aspirin EC 81 MG tablet      HYDROcodone-acetaminophen (NORCO/VICODIN) 5-325 MG tablet          Brief H and P: For complete  details please refer to admission H and P, but in brief Plan admit note by Dr. Antionette Char 9/26 Fransico Michael a 76 y.o.femalewith medical history significant for hyperlipidemia, type 2 diabetes mellitus, coronary artery disease with stents, and hypothyroidism, now presenting to the emergency department for evaluation of left arm weakness and reported facial droop. Patient reports that she was at work today and in her usual state of health when she noted the acute onset of weakness involving the left arm and tingling in the fingertips. A coworker reported that she had a left facial droop, prompting her presentation to the ED.  CT head was negative for any stroke, patient was admitted for TIA workup.    Hospital Course:  TIA (transient ischemic attack) resented with left arm and face weakness, paresthesias, left facial droop - Symptoms currently resolved - CT head negative for acute intracranial abnormality - MRI brain showed no acute intracranial abnormalities, mild for each chronic microvascular ischemic changes. Small chronic lacunar infarct in the right caudate head. - MRA head showed patent circle of Willis, no large vessel occlusion, aneurysm or significant stenosis - carotid Dopplers showed 1-39% ICA stenosis - continue aspirin 325 mg daily -lipid panel showed LDL 194, patient reports intolerance to statins, has not been able to tolerate crestor, Lipitor, most recent Pravachol, she was on 10 mg Pravachol and discontinued it 5 days ago due to leg cramps.  -placed on  Zetia, she can continue low dose Pravachol if she can tolerate it. I recommend patient to be considered for PCSK9 inhibitors, can be referred to [facility] lipid clinic.    Coronary artery disease - currently stable, no chest pain or shortness of breath,    HLD (hyperlipidemia) - lipid panel showed LDL 194, patient reports intolerance to statins, has not been able to tolerate crestor, Lipitor, most recent Pravachol, she was on 10 mg  Pravachol and discontinued it 5 days ago due to leg cramps.  - started on Zetia    Anxiety disorder - stable continue Celexa    Renal insufficiency/Chronic kidney disease stage II - Creatinine 1.2, close to baseline    Normocytic anemia - Baseline appears to be 11, currently 10.9    Hypothyroidism - continue Synthroid    Day of Discharge BP 135/74 (BP Location: Left Arm)   Pulse 88   Temp 98.4 F (36.9 C) (Oral)   Resp 17   Ht  (1.549 m)   Wt 68.5 kg (151 lb)   SpO2 97%   BMI 28.53 kg/m   Physical Exam: General: Alert and awake oriented x3 not in any acute distress. HEENT: anicteric sclera, pupils reactive to light and accommodation CVS: S1-S2 clear no murmur rubs or gallops Chest: clear to auscultation bilaterally, no wheezing rales or rhonchi Abdomen: soft nontender, nondistended, normal bowel sounds Extremities: no cyanosis, clubbing or edema noted bilaterally Neuro: Cranial nerves II-XII intact, no focal neurological deficits   The results of significant diagnostics from this hospitalization (including imaging, microbiology, ancillary and laboratory) are listed below for reference.    LAB RESULTS: Basic Metabolic Panel:  Recent Labs Lab 10/17/16 1802 10/17/16 1810  NA 135 139  K 3.8 3.8  CL 105 106  CO2 20*  --   GLUCOSE 155* 156*  BUN 27* 29*  CREATININE 1.16* 1.20*  CALCIUM 9.4  --    Liver Function Tests:  Recent Labs Lab 10/17/16 1802  AST 23  ALT 17  ALKPHOS 43  BILITOT 0.4  PROT 6.9  ALBUMIN 3.8   No results for input(s): LIPASE, AMYLASE in the last 168 hours. No results for input(s): AMMONIA in the last 168 hours. CBC:  Recent Labs Lab 10/17/16 1802 10/17/16 1810  WBC 7.0  --   NEUTROABS 4.1  --   HGB 10.3* 10.9*  HCT 31.5* 32.0*  MCV 88.2  --   PLT 325  --    Cardiac Enzymes:  Recent Labs Lab 10/17/16 1802  CKTOTAL 31*   BNP: Invalid input(s): POCBNP CBG:  Recent Labs Lab 10/18/16 0607  10/18/16 1148  GLUCAP 122* 120*    Significant Diagnostic Studies:  Ct Head Wo Contrast  Result Date: 10/17/2016 CLINICAL DATA:  Pt c/o left arm tingling/ weakness and left sided facial droop onset at about 1600.H/O DM EXAM: CT HEAD WITHOUT CONTRAST TECHNIQUE: Contiguous axial images were obtained from the base of the skull through the vertex without intravenous contrast. COMPARISON:  None. FINDINGS: Brain: No evidence of acute infarction, hemorrhage, hydrocephalus, extra-axial collection or mass lesion/mass effect. Mild periventricular white matter hypoattenuation is noted consistent with chronic microvascular ischemic change. Vascular: No hyperdense vessel or unexpected calcification. Skull: Normal. Negative for fracture or focal lesion. Sinuses/Orbits: Normal globes and orbits. Visualized sinuses and mastoid air cells are clear. Other: None. IMPRESSION: 1. No acute intracranial abnormalities. 2. Mild chronic microvascular ischemic change. Electronically Signed   By: Amie Portland M.D.   On: 10/17/2016 18:41   Mr  Brain Wo Contrast  Result Date: 10/17/2016 CLINICAL DATA:  76 y/o  F; TIA, initial examination. EXAM: MRI HEAD WITHOUT CONTRAST MRA HEAD WITHOUT CONTRAST TECHNIQUE: Multiplanar, multiecho pulse sequences of the brain and surrounding structures were obtained without intravenous contrast. Angiographic images of the head were obtained using MRA technique without contrast. COMPARISON:  10/17/2016 CT of the head FINDINGS: MRI HEAD FINDINGS Brain: No acute infarction, hemorrhage, hydrocephalus, extra-axial collection or mass lesion. Several nonspecific foci of T2 FLAIR hyperintense signal abnormality in subcortical and periventricular white matter are compatible with mild chronic microvascular ischemic changes for age. Mild brain parenchymal volume loss. Small chronic lacunar infarct within the right caudate head. Vascular: As below. Skull and upper cervical spine: Normal marrow signal.  Sinuses/Orbits: Negative. Other: None. MRA HEAD FINDINGS Internal carotid arteries: Patent. Mild irregularity of carotid siphons without significant stenosis compatible with atherosclerosis. Anterior cerebral arteries:  Patent. Middle cerebral arteries: Patent. Anterior communicating artery: Patent. Posterior communicating arteries:  Patent. Posterior cerebral arteries: Patent. Left P1 short segment of mild stenosis and right P2 short segment of moderate stenosis. Basilar artery:  Patent. Vertebral arteries:  Patent. No evidence of high-grade stenosis, large vessel occlusion, or aneurysm unless noted above. IMPRESSION: MRI head: 1. No acute intracranial abnormality identified. 2. Mild for age chronic microvascular ischemic changes and mild parenchymal volume loss of the brain. 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 MRA head: 1. Patent circle of Willis. No large vessel occlusion, aneurysm, or significant stenosis is identified. 2. Intracranial atherosclerosis with mild non stenotic irregularity of carotid siphons and segments of mild-to-moderate stenosis in the proximal posterior cerebral arteries. Electronically Signed   By: Mitzi Hansen M.D.   On: 10/17/2016 23:22   Mr Maxine Glenn Head Wo Contrast  Result Date: 10/17/2016 CLINICAL DATA:  76 y/o  F; TIA, initial examination. EXAM: MRI HEAD WITHOUT CONTRAST MRA HEAD WITHOUT CONTRAST TECHNIQUE: Multiplanar, multiecho pulse sequences of the brain and surrounding structures were obtained without intravenous contrast. Angiographic images of the head were obtained using MRA technique without contrast. COMPARISON:  10/17/2016 CT of the head FINDINGS: MRI HEAD FINDINGS Brain: No acute infarction, hemorrhage, hydrocephalus, extra-axial collection or mass lesion. Several nonspecific foci of T2 FLAIR hyperintense signal abnormality in subcortical and periventricular white matter are compatible with mild chronic microvascular ischemic changes for age. Mild  brain parenchymal volume loss. Small chronic lacunar infarct within the right caudate head. Vascular: As below. Skull and upper cervical spine: Normal marrow signal. Sinuses/Orbits: Negative. Other: None. MRA HEAD FINDINGS Internal carotid arteries: Patent. Mild irregularity of carotid siphons without significant stenosis compatible with atherosclerosis. Anterior cerebral arteries:  Patent. Middle cerebral arteries: Patent. Anterior communicating artery: Patent. Posterior communicating arteries:  Patent. Posterior cerebral arteries: Patent. Left P1 short segment of mild stenosis and right P2 short segment of moderate stenosis. Basilar artery:  Patent. Vertebral arteries:  Patent. No evidence of high-grade stenosis, large vessel occlusion, or aneurysm unless noted above. IMPRESSION: MRI head: 1. No acute intracranial abnormality identified. 2. Mild for age chronic microvascular ischemic changes and mild parenchymal volume loss of the brain. 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 MRA head: 1. Patent circle of Willis. No large vessel occlusion, aneurysm, or significant stenosis is identified. 2. Intracranial atherosclerosis with mild non stenotic irregularity of carotid siphons and segments of mild-to-moderate stenosis in the proximal posterior cerebral arteries. Electronically Signed   By: Mitzi Hansen M.D.   On: 10/17/2016 23:22    2D ECHO: Study Conclusions  - Left  ventricle: The cavity size was normal. Wall thickness was   normal. The estimated ejection fraction was 55%. Although no   diagnostic regional wall motion abnormality was identified, this   possibility cannot be completely excluded on the basis of this   study. Doppler parameters are consistent with abnormal left   ventricular relaxation (grade 1 diastolic dysfunction). - Aortic valve: Trileaflet; moderately calcified leaflets. There   was no stenosis. There was trivial regurgitation. - Mitral valve: Mildly  calcified annulus. Mildly calcified leaflets   . There was trivial regurgitation. - Right ventricle: The cavity size was normal. Systolic function   was normal. - Tricuspid valve: Peak RV-RA gradient (S): 21 mm Hg. - Pulmonary arteries: PA peak pressure: 24 mm Hg (S). - Inferior vena cava: The vessel was normal in size. The   respirophasic diameter changes were in the normal range (>= 50%),   consistent with normal central venous pressure.  Impressions:  - Normal LV size with EF 55%. Normal RV size and systolic function.   No significant valvular abnormalities.  Disposition and Follow-up: Discharge Instructions    Diet Carb Modified    Complete by:  As directed    Increase activity slowly    Complete by:  As directed        DISPOSITION: home    DISCHARGE FOLLOW-UP Follow-up Information    Marvel Plan, MD. Schedule an appointment as soon as possible for a visit in 4 week(s).   Specialty:  Neurology Contact information: [address] [place] [address] [phone]        Kandyce Rud, MD. Schedule an appointment as soon as possible for a visit in 2 week(s).   Specialty:  Family Medicine Contact information: [address] [facility] [place] [address] [phone]            Time spent on Discharge:   Signed:   Thad Ranger M.D. [facility] 10/18/2016, 3:48 PM Pager: [phone]

## 2016-10-19 LAB — VAS US CAROTID
LCCADSYS: 65 cm/s
LCCAPDIAS: 17 cm/s
LCCAPSYS: 83 cm/s
LEFT ECA DIAS: -11 cm/s
LEFT VERTEBRAL DIAS: -12 cm/s
LICADDIAS: -26 cm/s
LICAPDIAS: -21 cm/s
LICAPSYS: -91 cm/s
Left CCA dist dias: 13 cm/s
Left ICA dist sys: -96 cm/s
RCCAPSYS: 99 cm/s
RIGHT ECA DIAS: -18 cm/s
RIGHT VERTEBRAL DIAS: 7 cm/s
Right CCA prox dias: 20 cm/s
Right cca dist sys: -118 cm/s

## 2017-02-10 ENCOUNTER — Other Ambulatory Visit: Payer: Self-pay

## 2017-02-10 ENCOUNTER — Emergency Department
Admission: EM | Admit: 2017-02-10 | Discharge: 2017-02-10 | Disposition: A | Discharge status: Home / Self Care | Payer: Medicare Other | Attending: Emergency Medicine | Admitting: Emergency Medicine

## 2017-02-10 ENCOUNTER — Encounter: Payer: Self-pay | Admitting: Emergency Medicine

## 2017-02-10 DIAGNOSIS — G43809 Other migraine, not intractable, without status migrainosus: Secondary | ICD-10-CM | POA: Insufficient documentation

## 2017-02-10 DIAGNOSIS — Z79899 Other long term (current) drug therapy: Secondary | ICD-10-CM | POA: Diagnosis not present

## 2017-02-10 DIAGNOSIS — Z955 Presence of coronary angioplasty implant and graft: Secondary | ICD-10-CM | POA: Diagnosis not present

## 2017-02-10 DIAGNOSIS — Z8673 Personal history of transient ischemic attack (TIA), and cerebral infarction without residual deficits: Secondary | ICD-10-CM | POA: Insufficient documentation

## 2017-02-10 DIAGNOSIS — Z7984 Long term (current) use of oral hypoglycemic drugs: Secondary | ICD-10-CM | POA: Diagnosis not present

## 2017-02-10 DIAGNOSIS — Z7982 Long term (current) use of aspirin: Secondary | ICD-10-CM | POA: Diagnosis not present

## 2017-02-10 DIAGNOSIS — I2510 Atherosclerotic heart disease of native coronary artery without angina pectoris: Secondary | ICD-10-CM | POA: Diagnosis not present

## 2017-02-10 DIAGNOSIS — E039 Hypothyroidism, unspecified: Secondary | ICD-10-CM | POA: Insufficient documentation

## 2017-02-10 DIAGNOSIS — R51 Headache: Secondary | ICD-10-CM | POA: Diagnosis present

## 2017-02-10 DIAGNOSIS — E119 Type 2 diabetes mellitus without complications: Secondary | ICD-10-CM | POA: Insufficient documentation

## 2017-02-10 DIAGNOSIS — R112 Nausea with vomiting, unspecified: Secondary | ICD-10-CM | POA: Insufficient documentation

## 2017-02-10 DIAGNOSIS — Z7902 Long term (current) use of antithrombotics/antiplatelets: Secondary | ICD-10-CM | POA: Insufficient documentation

## 2017-02-10 LAB — CBC
HCT: 31.3 % — ABNORMAL LOW (ref 35.0–47.0)
Hemoglobin: 10.3 g/dL — ABNORMAL LOW (ref 12.0–16.0)
MCH: 28.6 pg (ref 26.0–34.0)
MCHC: 32.8 g/dL (ref 32.0–36.0)
MCV: 87.2 fL (ref 80.0–100.0)
Platelets: 287 10*3/uL (ref 150–440)
RBC: 3.59 MIL/uL — ABNORMAL LOW (ref 3.80–5.20)
RDW: 14.9 % — AB (ref 11.5–14.5)
WBC: 7.3 10*3/uL (ref 3.6–11.0)

## 2017-02-10 LAB — COMPREHENSIVE METABOLIC PANEL
ALBUMIN: 3.7 g/dL (ref 3.5–5.0)
ALT: 16 U/L (ref 14–54)
ANION GAP: 8 (ref 5–15)
AST: 23 U/L (ref 15–41)
Alkaline Phosphatase: 51 U/L (ref 38–126)
BILIRUBIN TOTAL: 0.8 mg/dL (ref 0.3–1.2)
BUN: 17 mg/dL (ref 6–20)
CO2: 25 mmol/L (ref 22–32)
Calcium: 8.9 mg/dL (ref 8.9–10.3)
Chloride: 103 mmol/L (ref 101–111)
Creatinine, Ser: 0.91 mg/dL (ref 0.44–1.00)
GFR calc Af Amer: >60 mL/min (ref >60)
GFR calc non Af Amer: 59 mL/min — ABNORMAL LOW (ref >60)
GLUCOSE: 221 mg/dL — AB (ref 65–99)
POTASSIUM: 3.8 mmol/L (ref 3.5–5.1)
SODIUM: 136 mmol/L (ref 135–145)
TOTAL PROTEIN: 6.8 g/dL (ref 6.5–8.1)

## 2017-02-10 LAB — TROPONIN I: Troponin I: <0.03 ng/mL (ref <0.03)

## 2017-02-10 MED ORDER — PREDNISONE 10 MG (21) PO TBPK: ORAL_TABLET | ORAL | 0 refills | Status: AC

## 2017-02-10 MED ORDER — SODIUM CHLORIDE 0.9 % IV BOLUS (SEPSIS)
1000.0000 mL | Freq: Once | INTRAVENOUS | Status: AC
  Administered 2017-02-10: 1000 mL via INTRAVENOUS

## 2017-02-10 MED ORDER — METOCLOPRAMIDE HCL 5 MG/ML IJ SOLN
10.0000 mg | Freq: Once | INTRAMUSCULAR | Status: AC
  Administered 2017-02-10: 10 mg via INTRAVENOUS
  Filled 2017-02-10: qty 2

## 2017-02-10 MED ORDER — BUTALBITAL-APAP-CAFFEINE 50-325-40 MG PO TABS
1.0000 | ORAL_TABLET | Freq: Once | ORAL | Status: AC
  Administered 2017-02-10: 1 via ORAL
  Filled 2017-02-10: qty 1

## 2017-02-10 MED ORDER — KETOROLAC TROMETHAMINE 30 MG/ML IJ SOLN
30.0000 mg | Freq: Once | INTRAMUSCULAR | Status: AC
  Administered 2017-02-10: 30 mg via INTRAVENOUS
  Filled 2017-02-10: qty 1

## 2017-02-10 MED ORDER — DIPHENHYDRAMINE HCL 50 MG/ML IJ SOLN
25.0000 mg | Freq: Once | INTRAMUSCULAR | Status: AC
  Administered 2017-02-10: 25 mg via INTRAVENOUS
  Filled 2017-02-10: qty 1

## 2017-02-10 NOTE — ED Notes (Signed)
Pt presents with c/o migraine headache x1 day with h/x of same. Pt reports 5-6 episodes of emesis since s/x's began. Pt denies unilateral weakness, no visual changes, no facial droop or slurred speech. Pt MAEW, bilateral grips are strong and equal.

## 2017-02-10 NOTE — Discharge Instructions (Signed)
Please follow up with your primary care physician for further evaluation of your headache.

## 2017-02-10 NOTE — ED Triage Notes (Signed)
Pt arrives POV to triage with c/o migraine x 1 day. Pt has hx of same. Pt is having some episodes of emesis due to the pain of her HA.

## 2017-02-10 NOTE — ED Provider Notes (Signed)
[facility] Emergency Department Provider Note   ____________________________________________   First MD Initiated Contact with Patient 02/10/17 [phone]     (approximate)  I have reviewed the triage vital signs and the nursing notes.   HISTORY  Chief Complaint Migraine    HPI Rachel Thompson is a 77 y.o. female who comes into the hospital today with a migraine headache.  The patient states that she has had it all day.  The patient does have a history of migraines and reports that it feels the same.  She has pain behind her right eye and she reports it is always there.  She reports that it feels like her previous migraines but bad enough that she needed to come in and get something to help treat it.  The patient states that this is not the worst headache.  She has not taken anything for pain because she reports that nothing helps.  She has had some nausea and vomiting.  She rates her pain a 10 out of 10 in intensity.  She denies any blurred vision chest pain or shortness of breath.  She is here today for evaluation.  Past Medical History:  Diagnosis Date  . Coronary artery disease   . Diabetes mellitus without complication [facility](HCC)     Patient Active Problem List   Diagnosis Date Noted  . Coronary artery disease 10/17/2016  . Left arm weakness 10/17/2016  . TIA (transient ischemic attack) 10/17/2016  . HLD (hyperlipidemia) 10/17/2016  . Anxiety disorder 10/17/2016  . Renal insufficiency 10/17/2016  . Normocytic anemia 10/17/2016  . Hypothyroidism 10/17/2016    Past Surgical History:  Procedure Laterality Date  . CARDIAC SURGERY     cardiac stents    Prior to Admission medications   Medication Sig Start Date End Date Taking? Authorizing Provider  aspirin 325 MG tablet Take 1 tablet (325 mg total) by mouth daily. 10/19/16   Rai, Delene Ruffiniipudeep K, MD  citalopram (CELEXA) 10 MG tablet Take 10 mg by mouth daily. 09/03/16   [provider]  esomeprazole  (NEXIUM 24HR) 20 MG capsule Take 20 mg by mouth daily at 12 noon.    [provider]  ezetimibe (ZETIA) 10 MG tablet Take 1 tablet (10 mg total) by mouth daily. 10/18/16   Rai, Ripudeep Kirtland BouchardK, MD  levothyroxine (SYNTHROID, LEVOTHROID) 100 MCG tablet Take 100 mcg by mouth daily. 09/25/16   [provider]  Multiple Vitamin (MULTI-VITAMINS) TABS Take 1 tablet by mouth daily.    [provider]  mupirocin ointment (BACTROBAN) 2 % Apply 1 application topically as needed.    [provider]  Omega-3 1000 MG CAPS Take 1,000 mcg by mouth daily.    [provider]  pravastatin (PRAVACHOL) 10 MG tablet Take 10 mg by mouth daily. 08/30/16 08/30/17  [provider]  predniSONE (STERAPRED UNI-PAK 21 TAB) 10 MG (21) TBPK tablet Take 6 tabs on day 1 Take 5 tabs on day 2 Take 4 tabs on day 3 Take 3 tabs on day 4 Take 2 tabs on day 5 Take 1 tab on day 6 02/10/17   Rebecka ApleyWebster, Mickelle Goupil P, MD  sitaGLIPtin-metformin (JANUMET) 50-1000 MG tablet Take 1 tablet by mouth 2 (two) times daily with a meal.     [provider]    Allergies Sulfa antibiotics  Family History  Problem Relation Age of Onset  . Breast cancer Mother [id number]    Social History Social History   Tobacco Use  . Smoking  status: Never Smoker  . Smokeless tobacco: Never Used  Substance Use Topics  . Alcohol use: Yes    Comment: socially  . Drug use: No    Review of Systems  Constitutional: No fever/chills Eyes: No visual changes. ENT: No sore throat. Cardiovascular: Denies chest pain. Respiratory: Denies shortness of breath. Gastrointestinal: Nausea and vomiting, diarrhea, No abdominal pain.  No constipation. Genitourinary: Negative for dysuria. Musculoskeletal: Negative for back pain. Skin: Negative for rash. Neurological: Headache   ____________________________________________   PHYSICAL EXAM:  VITAL SIGNS: ED Triage Vitals  Enc Vitals Group     BP 02/10/17 0314 (!) 184/74       Pulse Rate 02/10/17 0314 93     Resp 02/10/17 0314 18     Temp 02/10/17 0314 98.1 F (36.7 C)     Temp Source 02/10/17 0314 Oral     SpO2 02/10/17 0314 95 %     Weight 02/10/17 0314 151 lb (68.5 kg)     Height 02/10/17 0314 5\' 1"  (1.549 m)     Head Circumference --      Peak Flow --      Pain Score 02/10/17 0313 10     Pain Loc --      Pain Edu? --      Excl. in GC? --     Constitutional: Alert and oriented. Well appearing and in moderate distress. Eyes: Conjunctivae are normal. PERRL. EOMI. Head: Atraumatic. Nose: No congestion/rhinnorhea. Mouth/Throat: Mucous membranes are moist.  Oropharynx non-erythematous. Cardiovascular: Normal rate, regular rhythm. Grossly normal heart sounds.  Good peripheral circulation. Respiratory: Normal respiratory effort.  No retractions. Lungs CTAB. Gastrointestinal: Soft and nontender. No distention. Positive bowel sounds Musculoskeletal: No lower extremity tenderness nor edema.   Neurologic:  Normal speech and language.  Cranial nerves II through XII are grossly intact with no focal motor neuro deficit Skin:  Skin is warm, dry and intact.  Psychiatric: Mood and affect are normal.   ____________________________________________   LABS (all labs ordered are listed, but only abnormal results are displayed)  Labs Reviewed  CBC - Abnormal; Notable for the following components:      Result Value   RBC 3.59 (*)    Hemoglobin 10.3 (*)    HCT 31.3 (*)    RDW 14.9 (*)    All other components within normal limits  COMPREHENSIVE METABOLIC PANEL - Abnormal; Notable for the following components:   Glucose, Bld 221 (*)    GFR calc non Af Amer 59 (*)    All other components within normal limits  TROPONIN I   ____________________________________________  EKG  none ____________________________________________  RADIOLOGY  No results found.  ____________________________________________   PROCEDURES  Procedure(s) performed:  None  Procedures  Critical Care performed: No  ____________________________________________   INITIAL IMPRESSION / ASSESSMENT AND PLAN / ED COURSE  As part of my medical decision making, I reviewed the following data within the electronic MEDICAL RECORD NUMBER Notes from prior ED visits and [place] Controlled Substance Database   This is a 77 year old female who comes into the hospital today with a migraine headache.  The patient has a history of migraine headaches.  I will order some Reglan, Benadryl, Toradol and a liter of normal saline for the patient.  I will also check some basic blood work to include a CBC CMP and a troponin in the event that the patient is having acute coronary syndrome or some electrolyte derangement.  She will be reassessed when she is received her  fluids and medications.    The patient received a dose of Fioricet and her headache went from 10 to a 2.  The patient will be discharged home to follow-up.  She has no other complaints or concerns.  ____________________________________________   FINAL CLINICAL IMPRESSION(S) / ED DIAGNOSES  Final diagnoses:  Other migraine without status migrainosus, not intractable     ED Discharge Orders        Ordered    predniSONE (STERAPRED UNI-PAK 21 TAB) 10 MG (21) TBPK tablet     02/10/17 1610       Note:  This document was prepared using Dragon voice recognition software and may include unintentional dictation errors.    Rebecka Apley, MD 02/10/17 [phone]

## 2017-03-17 ENCOUNTER — Emergency Department: Payer: Medicare Other

## 2017-03-17 ENCOUNTER — Encounter: Payer: Self-pay | Admitting: Emergency Medicine

## 2017-03-17 ENCOUNTER — Emergency Department
Admission: EM | Admit: 2017-03-17 | Discharge: 2017-03-17 | Disposition: A | Discharge status: Home / Self Care | Payer: Medicare Other | Attending: Emergency Medicine | Admitting: Emergency Medicine

## 2017-03-17 DIAGNOSIS — G459 Transient cerebral ischemic attack, unspecified: Secondary | ICD-10-CM | POA: Diagnosis not present

## 2017-03-17 DIAGNOSIS — Z7902 Long term (current) use of antithrombotics/antiplatelets: Secondary | ICD-10-CM | POA: Diagnosis not present

## 2017-03-17 DIAGNOSIS — Z955 Presence of coronary angioplasty implant and graft: Secondary | ICD-10-CM | POA: Diagnosis not present

## 2017-03-17 DIAGNOSIS — H538 Other visual disturbances: Secondary | ICD-10-CM | POA: Diagnosis present

## 2017-03-17 DIAGNOSIS — I2510 Atherosclerotic heart disease of native coronary artery without angina pectoris: Secondary | ICD-10-CM | POA: Insufficient documentation

## 2017-03-17 DIAGNOSIS — E119 Type 2 diabetes mellitus without complications: Secondary | ICD-10-CM | POA: Insufficient documentation

## 2017-03-17 DIAGNOSIS — Z7982 Long term (current) use of aspirin: Secondary | ICD-10-CM | POA: Diagnosis not present

## 2017-03-17 DIAGNOSIS — E039 Hypothyroidism, unspecified: Secondary | ICD-10-CM | POA: Insufficient documentation

## 2017-03-17 DIAGNOSIS — R202 Paresthesia of skin: Secondary | ICD-10-CM | POA: Insufficient documentation

## 2017-03-17 DIAGNOSIS — Z7984 Long term (current) use of oral hypoglycemic drugs: Secondary | ICD-10-CM | POA: Insufficient documentation

## 2017-03-17 LAB — DIFFERENTIAL
BASOS ABS: 0.1 10*3/uL (ref 0–0.1)
Basophils Relative: 1 %
Eosinophils Absolute: 0.2 10*3/uL (ref 0–0.7)
Eosinophils Relative: 3 %
LYMPHS PCT: 27 %
Lymphs Abs: 1.6 10*3/uL (ref 1.0–3.6)
Monocytes Absolute: 0.6 10*3/uL (ref 0.2–0.9)
Monocytes Relative: 11 %
NEUTROS ABS: 3.4 10*3/uL (ref 1.4–6.5)
NEUTROS PCT: 58 %

## 2017-03-17 LAB — COMPREHENSIVE METABOLIC PANEL
ALBUMIN: 4.5 g/dL (ref 3.5–5.0)
ALK PHOS: 59 U/L (ref 38–126)
ALT: 20 U/L (ref 14–54)
AST: 26 U/L (ref 15–41)
Anion gap: 10 (ref 5–15)
BUN: 19 mg/dL (ref 6–20)
CALCIUM: 9.6 mg/dL (ref 8.9–10.3)
CO2: 25 mmol/L (ref 22–32)
CREATININE: 0.86 mg/dL (ref 0.44–1.00)
Chloride: 101 mmol/L (ref 101–111)
GFR calc Af Amer: >60 mL/min (ref >60)
GFR calc non Af Amer: >60 mL/min (ref >60)
GLUCOSE: 105 mg/dL — AB (ref 65–99)
Potassium: 4.1 mmol/L (ref 3.5–5.1)
SODIUM: 136 mmol/L (ref 135–145)
Total Bilirubin: 0.5 mg/dL (ref 0.3–1.2)
Total Protein: 8 g/dL (ref 6.5–8.1)

## 2017-03-17 LAB — PROTIME-INR
INR: 0.92
PROTHROMBIN TIME: 12.3 s (ref 11.4–15.2)

## 2017-03-17 LAB — CBC
HCT: 34.8 % — ABNORMAL LOW (ref 35.0–47.0)
Hemoglobin: 11.5 g/dL — ABNORMAL LOW (ref 12.0–16.0)
MCH: 28.9 pg (ref 26.0–34.0)
MCHC: 33.1 g/dL (ref 32.0–36.0)
MCV: 87.4 fL (ref 80.0–100.0)
PLATELETS: 351 10*3/uL (ref 150–440)
RBC: 3.98 MIL/uL (ref 3.80–5.20)
RDW: 14.3 % (ref 11.5–14.5)
WBC: 5.9 10*3/uL (ref 3.6–11.0)

## 2017-03-17 LAB — GLUCOSE, CAPILLARY
Glucose-Capillary: 93 mg/dL (ref 65–99)
Glucose-Capillary: 97 mg/dL (ref 65–99)

## 2017-03-17 LAB — APTT: aPTT: 31 seconds (ref 24–36)

## 2017-03-17 LAB — TROPONIN I: Troponin I: <0.03 ng/mL (ref <0.03)

## 2017-03-17 MED ORDER — GADOBENATE DIMEGLUMINE 529 MG/ML IV SOLN
14.0000 mL | Freq: Once | INTRAVENOUS | Status: AC | PRN
  Administered 2017-03-17: 14 mL via INTRAVENOUS

## 2017-03-17 MED ORDER — LORAZEPAM 2 MG/ML IJ SOLN
0.5000 mg | Freq: Once | INTRAMUSCULAR | Status: AC | PRN
  Administered 2017-03-17: 0.5 mg via INTRAVENOUS
  Filled 2017-03-17: qty 1

## 2017-03-17 MED ORDER — CLOPIDOGREL BISULFATE 75 MG PO TABS: 75.0000 mg | ORAL_TABLET | Freq: Every day | ORAL | 0 refills | Status: AC

## 2017-03-17 MED ORDER — LORAZEPAM 2 MG/ML IJ SOLN
0.5000 mg | Freq: Once | INTRAMUSCULAR | Status: AC
  Administered 2017-03-17: 0.5 mg via INTRAVENOUS

## 2017-03-17 NOTE — ED Notes (Signed)
Pt ready for MRI, EDT Kennedy Buckerhanh to transport patient shortly to MRI

## 2017-03-17 NOTE — ED Notes (Signed)
Pt up to toilet with no difficulty 

## 2017-03-17 NOTE — ED Notes (Signed)
Pt advised of plan of care. Pt up to toilet

## 2017-03-17 NOTE — ED Notes (Addendum)
Per MD Shaune PollackLord ok to call neurologist back for consult for patient. Neurologist called and relayed patient information to RN . Per RN Calton GoldsLisa Souther neurologist doctor will call back in about 15 minutes.

## 2017-03-17 NOTE — ED Notes (Signed)
Neurologist called and RN came on asking if it was Code Stroke or just consult. This RN advised that she was told it was Code Stroke but would verify with MD. MD unavailable, per Charge Greg tell RN we will call back.

## 2017-03-17 NOTE — ED Notes (Signed)
MD Neurologist speaking with patient at this time. RN at bedside

## 2017-03-17 NOTE — ED Provider Notes (Signed)
Mr Angiogram Neck W Or Wo Contrast  Result Date: 03/17/2017 CLINICAL DATA:  Episode of left facial numbness, right arm numbness, visual changes 7 hours ago. Symptoms have since resolved EXAM: MRI HEAD WITHOUT CONTRAST MRA HEAD WITHOUT CONTRAST MRA NECK WITHOUT AND WITH CONTRAST TECHNIQUE: Multiplanar, multiecho pulse sequences of the brain and surrounding structures were obtained without intravenous contrast. Angiographic images of the Circle of Willis were obtained using MRA technique without intravenous contrast. Angiographic images of the neck were obtained using MRA technique without and with intravenous contrast. Carotid stenosis measurements (when applicable) are obtained utilizing NASCET criteria, using the distal internal carotid diameter as the denominator. CONTRAST:  14mL MULTIHANCE GADOBENATE DIMEGLUMINE 529 MG/ML IV SOLN COMPARISON:  CT head without contrast from the same day. MRI brain and MRA head 10/17/2016 FINDINGS: MRI HEAD FINDINGS Brain: The diffusion-weighted images demonstrate no acute or subacute infarction. Acute hemorrhage or mass lesion is present. Atrophy and moderate white matter disease is stable. 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 White matter changes extend into the brainstem. The cerebellum is unremarkable. The internal auditory canals are normal bilaterally. Ventricles are of normal size. No significant extra-axial fluid collection is present. Vascular: Flow is present in the major intracranial arteries. Skull and upper cervical spine: Skull base is normal. The craniocervical junction is normal. The upper cervical spine is unremarkable. Sinuses/Orbits: The paranasal sinuses and mastoid air cells are clear. Globes and orbits are within normal limits. MRA HEAD FINDINGS Internal carotid arteries are within normal limits from the high cervical segments through the ICA termini bilaterally. A1 and M1 segments are normal. No definite anterior communicating  artery is present. Signal loss at the right MCA bifurcation is in part artifactual. MCA bifurcations are otherwise intact bilaterally. There is some attenuation of MCA branch vessels bilaterally. The left vertebral artery is the dominant vessel. Right PICA origin is visualized. The left AICA is dominant. Mild narrowing of P2 segments is stable bilaterally. There is attenuation of distal PCA branch vessels without change. MRA NECK FINDINGS The time-of-flight images demonstrate no significant flow disturbance at either carotid bifurcation. Flow is antegrade within the vertebral arteries bilaterally. Postcontrast images demonstrate a 3 vessel arch configuration. There is no significant proximal stenosis. The right common carotid artery is within normal limits. The bifurcation is unremarkable. Cervical right ICA is normal. The left common carotid artery is within normal limits. Bifurcation is unremarkable. Cervical left ICA is normal. Both vertebral arteries originate from the subclavian arteries without significant stenosis. The left vertebral artery is the dominant vessel. There is no focal stenosis or vascular injury to either vertebral artery in the neck. IMPRESSION: 1. No acute intracranial abnormality or significant interval change. 2. Stable atrophy it white matter disease, moderately advanced for age. This likely reflects the sequela of chronic microvascular ischemia. 3. MRA circle-of-Willis demonstrates no significant proximal stenosis, aneurysm, or branch vessel occlusion. 4. Mild diffuse distal small vessel disease corresponds with the white matter changes. This likely reflects intracranial atherosclerotic disease. 5. MRA of the neck is within normal limits. Electronically Signed   By: Marin Roberts M.D.   On: 03/17/2017 18:39   Mr Brain Wo Contrast (neuro Protocol)  Result Date: 03/17/2017 CLINICAL DATA:  Episode of left facial numbness, right arm numbness, visual changes 7 hours ago. Symptoms  have since resolved EXAM: MRI HEAD WITHOUT CONTRAST MRA HEAD WITHOUT CONTRAST MRA NECK WITHOUT AND WITH CONTRAST TECHNIQUE: Multiplanar, multiecho pulse sequences of the brain and surrounding structures  were obtained without intravenous contrast. Angiographic images of the Circle of Willis were obtained using MRA technique without intravenous contrast. Angiographic images of the neck were obtained using MRA technique without and with intravenous contrast. Carotid stenosis measurements (when applicable) are obtained utilizing NASCET criteria, using the distal internal carotid diameter as the denominator. CONTRAST:  14mL MULTIHANCE GADOBENATE DIMEGLUMINE 529 MG/ML IV SOLN COMPARISON:  CT head without contrast from the same day. MRI brain and MRA head 10/17/2016 FINDINGS: MRI HEAD FINDINGS Brain: The diffusion-weighted images demonstrate no acute or subacute infarction. Acute hemorrhage or mass lesion is present. Atrophy and moderate white matter disease is stable. 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 White matter changes extend into the brainstem. The cerebellum is unremarkable. The internal auditory canals are normal bilaterally. Ventricles are of normal size. No significant extra-axial fluid collection is present. Vascular: Flow is present in the major intracranial arteries. Skull and upper cervical spine: Skull base is normal. The craniocervical junction is normal. The upper cervical spine is unremarkable. Sinuses/Orbits: The paranasal sinuses and mastoid air cells are clear. Globes and orbits are within normal limits. MRA HEAD FINDINGS Internal carotid arteries are within normal limits from the high cervical segments through the ICA termini bilaterally. A1 and M1 segments are normal. No definite anterior communicating artery is present. Signal loss at the right MCA bifurcation is in part artifactual. MCA bifurcations are otherwise intact bilaterally. There is some attenuation of MCA  branch vessels bilaterally. The left vertebral artery is the dominant vessel. Right PICA origin is visualized. The left AICA is dominant. Mild narrowing of P2 segments is stable bilaterally. There is attenuation of distal PCA branch vessels without change. MRA NECK FINDINGS The time-of-flight images demonstrate no significant flow disturbance at either carotid bifurcation. Flow is antegrade within the vertebral arteries bilaterally. Postcontrast images demonstrate a 3 vessel arch configuration. There is no significant proximal stenosis. The right common carotid artery is within normal limits. The bifurcation is unremarkable. Cervical right ICA is normal. The left common carotid artery is within normal limits. Bifurcation is unremarkable. Cervical left ICA is normal. Both vertebral arteries originate from the subclavian arteries without significant stenosis. The left vertebral artery is the dominant vessel. There is no focal stenosis or vascular injury to either vertebral artery in the neck. IMPRESSION: 1. No acute intracranial abnormality or significant interval change. 2. Stable atrophy it white matter disease, moderately advanced for age. This likely reflects the sequela of chronic microvascular ischemia. 3. MRA circle-of-Willis demonstrates no significant proximal stenosis, aneurysm, or branch vessel occlusion. 4. Mild diffuse distal small vessel disease corresponds with the white matter changes. This likely reflects intracranial atherosclerotic disease. 5. MRA of the neck is within normal limits. Electronically Signed   By: Marin Roberts M.D.   On: 03/17/2017 18:39   Mr Maxine Glenn Head (cerebral Arteries)  Result Date: 03/17/2017 CLINICAL DATA:  Episode of left facial numbness, right arm numbness, visual changes 7 hours ago. Symptoms have since resolved EXAM: MRI HEAD WITHOUT CONTRAST MRA HEAD WITHOUT CONTRAST MRA NECK WITHOUT AND WITH CONTRAST TECHNIQUE: Multiplanar, multiecho pulse sequences of the brain  and surrounding structures were obtained without intravenous contrast. Angiographic images of the Circle of Willis were obtained using MRA technique without intravenous contrast. Angiographic images of the neck were obtained using MRA technique without and with intravenous contrast. Carotid stenosis measurements (when applicable) are obtained utilizing NASCET criteria, using the distal internal carotid diameter as the denominator. CONTRAST:  14mL MULTIHANCE  GADOBENATE DIMEGLUMINE 529 MG/ML IV SOLN COMPARISON:  CT head without contrast from the same day. MRI brain and MRA head 10/17/2016 FINDINGS: MRI HEAD FINDINGS Brain: The diffusion-weighted images demonstrate no acute or subacute infarction. Acute hemorrhage or mass lesion is present. Atrophy and moderate white matter disease is stable. 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 White matter changes extend into the brainstem. The cerebellum is unremarkable. The internal auditory canals are normal bilaterally. Ventricles are of normal size. No significant extra-axial fluid collection is present. Vascular: Flow is present in the major intracranial arteries. Skull and upper cervical spine: Skull base is normal. The craniocervical junction is normal. The upper cervical spine is unremarkable. Sinuses/Orbits: The paranasal sinuses and mastoid air cells are clear. Globes and orbits are within normal limits. MRA HEAD FINDINGS Internal carotid arteries are within normal limits from the high cervical segments through the ICA termini bilaterally. A1 and M1 segments are normal. No definite anterior communicating artery is present. Signal loss at the right MCA bifurcation is in part artifactual. MCA bifurcations are otherwise intact bilaterally. There is some attenuation of MCA branch vessels bilaterally. The left vertebral artery is the dominant vessel. Right PICA origin is visualized. The left AICA is dominant. Mild narrowing of P2 segments is stable  bilaterally. There is attenuation of distal PCA branch vessels without change. MRA NECK FINDINGS The time-of-flight images demonstrate no significant flow disturbance at either carotid bifurcation. Flow is antegrade within the vertebral arteries bilaterally. Postcontrast images demonstrate a 3 vessel arch configuration. There is no significant proximal stenosis. The right common carotid artery is within normal limits. The bifurcation is unremarkable. Cervical right ICA is normal. The left common carotid artery is within normal limits. Bifurcation is unremarkable. Cervical left ICA is normal. Both vertebral arteries originate from the subclavian arteries without significant stenosis. The left vertebral artery is the dominant vessel. There is no focal stenosis or vascular injury to either vertebral artery in the neck. IMPRESSION: 1. No acute intracranial abnormality or significant interval change. 2. Stable atrophy it white matter disease, moderately advanced for age. This likely reflects the sequela of chronic microvascular ischemia. 3. MRA circle-of-Willis demonstrates no significant proximal stenosis, aneurysm, or branch vessel occlusion. 4. Mild diffuse distal small vessel disease corresponds with the white matter changes. This likely reflects intracranial atherosclerotic disease. 5. MRA of the neck is within normal limits. Electronically Signed   By: Marin Roberts M.D.   On: 03/17/2017 18:39   Ct Head Code Stroke Wo Contrast  Result Date: 03/17/2017 CLINICAL DATA:  Code stroke. Acute presentation with visual disturbance and left-sided facial numbness. Right arm numbness. EXAM: CT HEAD WITHOUT CONTRAST TECHNIQUE: Contiguous axial images were obtained from the base of the skull through the vertex without intravenous contrast. COMPARISON:  MRI 10/17/2016 FINDINGS: Brain: No acute finding by CT. Chronic small-vessel ischemic changes of the hemispheric white matter. Old lacunar insults right caudate head.  No sign of mass lesion, hemorrhage, hydrocephalus or extra-axial collection. Vascular: There is atherosclerotic calcification of the major vessels at the base of the brain. Skull: Negative Sinuses/Orbits: Clear/normal Other: None ASPECTS ([place] Stroke Program Early CT Score) - Ganglionic level infarction (caudate, lentiform nuclei, internal capsule, insula, M1-M3 cortex): 7 - Supraganglionic infarction (M4-M6 cortex): 3 Total score (0-10 with 10 being normal): 10 IMPRESSION: 1. No acute finding by CT. Atrophy and chronic small-vessel ischemic changes. 2. ASPECTS is 10. 3. These results were called by telephone at the time of interpretation on  03/17/2017 at 12:10 pm to Dr. Governor RooksEBECCA LORD , who verbally acknowledged these results. Electronically Signed   By: Paulina FusiMark  Shogry M.D.   On: 03/17/2017 12:13    Patient alert and oriented.  No distress.  No ongoing neurologic concerns or symptoms reported by patient or family.  Understanding of plan to add Plavix to her regimen, close follow-up with her doctor and neurology.  MRI/MRA negative for acute finding.  Return precautions and treatment recommendations and follow-up discussed with the patient who is agreeable with the plan.    Sharyn CreamerQuale, Mark, MD 03/17/17 [phone]

## 2017-03-17 NOTE — ED Provider Notes (Addendum)
[facility] Emergency Department Provider Note ____________________________________________   I have reviewed the triage vital signs and the triage nursing note.  HISTORY  Chief Complaint Code Stroke   Historian Patient and husband  HPI Rachel Thompson is a 77 y.o. female presenting for evaluation of neurologic complaint.  She states that she was normal this morning and around 1115 at church she noticed she is having trouble seeing the versus, and then noted left facial tingling followed by right arm tingling.  Symptoms lasted it sounds like it may be 15 minutes or less.  Indicates symptoms are now currently gone.  Patient states that she has had a number of episodes where she had neurologic symptoms, both with and without headaches which have been worked up what she states as possible stroke, but she has been told that they were migraines.  She does state that she was worked up with a hospital admission in September of last year for possible mini stroke.  Currently no headache.  She has not been recently ill.  No nausea.  I was able to review her prior hospital record from September 2018 and she had echo as well as carotid Dopplers and was recommended to increase aspirin 81 mg to 325 but she has not done that she is still taking 81 mg by mouth.   Past Medical History:  Diagnosis Date  . Coronary artery disease   . Diabetes mellitus without complication [facility](HCC)     Patient Active Problem List   Diagnosis Date Noted  . Coronary artery disease 10/17/2016  . Left arm weakness 10/17/2016  . TIA (transient ischemic attack) 10/17/2016  . HLD (hyperlipidemia) 10/17/2016  . Anxiety disorder 10/17/2016  . Renal insufficiency 10/17/2016  . Normocytic anemia 10/17/2016  . Hypothyroidism 10/17/2016    Past Surgical History:  Procedure Laterality Date  . CARDIAC SURGERY     cardiac stents    Prior to Admission medications   Medication Sig Start Date End Date  Taking? Authorizing Provider  aspirin 325 MG tablet Take 1 tablet (325 mg total) by mouth daily. Patient taking differently: Take 81 mg by mouth daily.  10/19/16  Yes Rai, Ripudeep K, MD  citalopram (CELEXA) 10 MG tablet Take 10 mg by mouth daily. 09/03/16  Yes [provider]  esomeprazole (NEXIUM 24HR) 20 MG capsule Take 20 mg by mouth daily.    Yes [provider]  ezetimibe (ZETIA) 10 MG tablet Take 1 tablet (10 mg total) by mouth daily. 10/18/16  Yes Rai, Ripudeep K, MD  levothyroxine (SYNTHROID, LEVOTHROID) 100 MCG tablet Take 100 mcg by mouth daily.  09/25/16  Yes [provider]  Multiple Vitamin (MULTI-VITAMINS) TABS Take 1 tablet by mouth daily.   Yes [provider]  Omega-3 1000 MG CAPS Take 1,000 mcg by mouth daily.   Yes [provider]  sitaGLIPtin-metformin (JANUMET) 50-1000 MG tablet Take 1 tablet by mouth 2 (two) times daily with a meal.    Yes [provider]  clopidogrel (PLAVIX) 75 MG tablet Take 1 tablet (75 mg total) by mouth daily. 03/17/17 03/17/18  Governor RooksLord, Karime Scheuermann, MD  mupirocin ointment (BACTROBAN) 2 % Apply 1 application topically as needed.    [provider]  predniSONE (STERAPRED UNI-PAK 21 TAB) 10 MG (21) TBPK tablet Take 6 tabs on day 1 Take 5 tabs on day 2 Take 4 tabs on day 3 Take 3 tabs on day 4 Take 2 tabs on day 5 Take 1 tab on day 6  Patient not taking: Reported on 03/17/2017 02/10/17   Rebecka Apley, MD    Allergies  Allergen Reactions  . Sulfa Antibiotics Rash    Family History  Problem Relation Age of Onset  . Breast cancer Mother 46    Social History Social History   Tobacco Use  . Smoking status: Never Smoker  . Smokeless tobacco: Never Used  Substance Use Topics  . Alcohol use: Yes    Comment: socially  . Drug use: No    Review of Systems  Constitutional: Negative for recent illness. Eyes: She reported having trouble figuring out what she was reading earlier but no vision  changes now. ENT: Negative for sore throat. Cardiovascular: Negative for chest pain. Respiratory: Negative for shortness of breath. Gastrointestinal: Negative for abdominal pain, vomiting and diarrhea. Genitourinary: Negative for dysuria. Musculoskeletal: Negative for back pain. Skin: Negative for rash. Neurological: Negative for headache.  ____________________________________________   PHYSICAL EXAM:  VITAL SIGNS: ED Triage Vitals  Enc Vitals Group     BP 03/17/17 1155 (!) 162/83     Pulse Rate 03/17/17 1155 92     Resp 03/17/17 1155 18     Temp 03/17/17 1155 97.7 F (36.5 C)     Temp Source 03/17/17 1155 Oral     SpO2 03/17/17 1155 97 %     Weight 03/17/17 1156 151 lb (68.5 kg)     Height --      Head Circumference --      Peak Flow --      Pain Score --      Pain Loc --      Pain Edu? --      Excl. in GC? --      Constitutional: Alert and oriented. Well appearing and in no distress. HEENT   Head: Normocephalic and atraumatic.      Eyes: Conjunctivae are normal. Pupils equal and round.       Ears:         Nose: No congestion/rhinnorhea.   Mouth/Throat: Mucous membranes are moist.   Neck: No stridor. Cardiovascular/Chest: Normal rate, regular rhythm.  No murmurs, rubs, or gallops. Respiratory: Normal respiratory effort without tachypnea nor retractions. Breath sounds are clear and equal bilaterally. No wheezes/rales/rhonchi. Gastrointestinal: Soft. No distention, no guarding, no rebound. Nontender.    Genitourinary/rectal:Deferred Musculoskeletal: Nontender with normal range of motion in all extremities. No joint effusions.  No lower extremity tenderness.  No edema. Neurologic: No facial droop.  Coordination intact.  Finger-nose intact bilaterally.  5 out of 5 strength in 4 extremity's.  No sensory deficit.  Normal speech and language.  Skin:  Skin is warm, dry and intact. No rash noted. Psychiatric: Mood and affect are normal. Speech and behavior are  normal. Patient exhibits appropriate insight and judgment.   ____________________________________________  LABS (pertinent positives/negatives) I, Governor Rooks, MD the attending physician have reviewed the labs noted below.  Labs Reviewed  CBC - Abnormal; Notable for the following components:      Result Value   Hemoglobin 11.5 (*)    HCT 34.8 (*)    All other components within normal limits  COMPREHENSIVE METABOLIC PANEL - Abnormal; Notable for the following components:   Glucose, Bld 105 (*)    All other components within normal limits  PROTIME-INR  APTT  DIFFERENTIAL  TROPONIN I  GLUCOSE, CAPILLARY  GLUCOSE, CAPILLARY  CBG MONITORING, ED    ____________________________________________    EKG I, Governor Rooks, MD, the attending physician have personally viewed  and interpreted all ECGs.  86 bpm.  Normal sinus rhythm.  Right bundle branch block.  Nonspecific ST and T wave. ____________________________________________  RADIOLOGY   CT head without contrast: Radiologist report IMPRESSION: 1. No acute finding by CT. Atrophy and chronic small-vessel ischemic changes. 2. ASPECTS is 10. 3. These results were called by telephone at the time of interpretation on 03/17/2017 at 12:10 pm to Dr. Governor Rooks , who verbally acknowledged these results.  MRI brain neuro without contrast radiologist report: Pending MRA head and neck: Pending __________________________________________  PROCEDURES  Procedure(s) performed: None  Critical Care performed: CRITICAL CARE Performed by: Governor Rooks   Total critical care time: 30 minutes  Critical care time was exclusive of separately billable procedures and treating other patients.  Critical care was necessary to treat or prevent imminent or life-threatening deterioration.  Critical care was time spent personally by me on the following activities: development of treatment plan with patient and/or surrogate as well as nursing,  discussions with consultants, evaluation of patient's response to treatment, examination of patient, obtaining history from patient or surrogate, ordering and performing treatments and interventions, ordering and review of laboratory studies, ordering and review of radiographic studies, pulse oximetry and re-evaluation of patient's condition.    ____________________________________________  ED COURSE / ASSESSMENT AND PLAN  Pertinent labs & imaging results that were available during my care of the patient were reviewed by me and considered in my medical decision making (see chart for details).   Given patient's symptoms are now completely resolved, made as consult, rather than code stroke although seem expeditious workup and neuro consult were made.  I spoke with the radiologist, no acute finding on head CT.  I am going to plan for MRI and MRA of the head and neck.  Patient states that she is been worked up for similar type symptoms although not the exact same distribution in the past and was told it was migraines.  She is not having a headache with this and states that at times she has not had a headache but just the neurologic symptoms that then resolved.  She also states that just recently in September of last year she was admitted to the hospital worked up for possibility of mini stroke.  After review of patient's hospitalization note from September 2018 at [facility], she had an echocardiogram and carotid ultrasound done at that point time as well as the MRI and MRA.  It looks like she was discharged with a diagnosis of TIA and recommended to increase aspirin 81 mg to 325, but she states that she did not increase the dose and she is just been taking 81 mg.  I requested to speak with the neurologist who performed the consult because initial note had recommended hospital admission for workup of TIA, however I am not sure they were aware of recent hospitalization and workup.  Patient would  really like to go home if at all possible.  Patient care to be transferred to Dr. Fanny Bien at 3 PM.  Disposition dependent on MRI and MRA results.  I have discussed with family that if this is reassuring, she may be discharged with my prepared discharge instructions and new prescription of Plavix.  Addended to include, patient states that she had previously taken Plavix with her stent and then was taken off of it because "blood counts dropped.  "We discussed at length what that could have meant, because I do not really have any documentation of it.  It certainly  does not sound like it was an allergic reaction.  She does not report a history of known internal bleeding.  Best as I can tell, potentially she had anemia and risk versus benefit was chosen to discontinue.  At this point I am concerned about changing and picking a different or stronger anticoagulant not knowing the exact issue, because I would not want to pick something even stronger in terms of if the issue was simply becoming anemic.  She again states that it did not seem to be an allergic sort of issue.  She is comfortable starting on it.  I think right now the risk versus benefit seems to be on the side of benefit.  She will watch carefully for any black or bloody stool or any easy bleeding or bruising.  She is going to see her doctor primary doctor as well as neurologist this week and they can discuss whether or not they want to continue this regimen, if there is any history of this issue with the Plavix which was at this point several years ago.   DIFFERENTIAL DIAGNOSIS: Including but not limited to stroke, TIA, migraine, vasculitis, seizure, etc.  CONSULTATIONS:   Tele-neurology, spoke to physician by phone Dr. Suzie Portela, we discussed the previous hospital workup including echocardiogram and carotid Dopplers and the fact that she had not changed her medication in terms of aspirin.  We discussed the ability to go ahead and get the MRI of the  brain as well as MRA of the head and neck here in the emergency department, and he was comfortable recommending that if the MRI and MRA are reassuring, the patient could be discharged home from the emergency permit to follow-up with her primary care physician this week as well as a neurologist which I will recommend her.  She is also recommended from neurologist to continue baby aspirin but add Plavix.  I have printed this prescription.     Patient / Family / Caregiver informed of clinical course, medical decision-making process, and agree with plan.   I discussed return precautions, follow-up instructions, and discharge instructions with patient and/or family.  Discharge Instructions : From Dr. Shaune Pollack: You were evaluated after neurologic symptoms which are now completely resolved, and as we discussed, I am suspicious of mini stroke also called transient ischemic attack or TIA.  You are recommended to continue your baby aspirin daily and add Plavix.  You need to follow-up both with your primary care doctor, as well as a neurologist which is provided.  Return to the emergency department immediately for any return of neurologic symptoms including slurred speech, vision problems, facial droop, weakness, numbness, confusion or altered mental status, or any other symptoms concerning to you.    ___________________________________________   FINAL CLINICAL IMPRESSION(S) / ED DIAGNOSES   Final diagnoses:  TIA (transient ischemic attack)      ___________________________________________        Note: This dictation was prepared with Dragon dictation. Any transcriptional errors that result from this process are unintentional    Governor Rooks, MD 03/17/17 1451    Governor Rooks, MD 03/17/17 [phone]

## 2017-03-17 NOTE — ED Notes (Signed)
Called back and spoke with Calton GoldsLisa Souther RN to make sure MD would be calling back for consult with pt. Per RN Misty StanleyLisa they should be calling shortly and if not to push button and she would page them again.

## 2017-03-17 NOTE — ED Notes (Signed)
FN: pt presents with vision changes, unable to speak and right arm numbness, episode started at 1115 while at church lasted about 15 min, pt states all symptoms have resolved on arrival except the right arm numbness.  Code stroke paged to [id number], pt going to rm 3 atfer CT scan, ed charge aware.

## 2017-03-17 NOTE — ED Notes (Signed)
Pt back from MRI 

## 2017-03-17 NOTE — ED Notes (Signed)
Primary RN notified that pt is in CT scan and will be coming to room 3.

## 2017-03-17 NOTE — ED Notes (Signed)
The EKG was completed and signed by Dr. Lord. The EKG was also exported into the system. 

## 2017-03-17 NOTE — ED Notes (Signed)
MD Lord at bedside. 

## 2017-03-17 NOTE — ED Notes (Signed)
Patient transported to MRI 

## 2017-03-17 NOTE — ED Notes (Signed)
MRI called and will be calling to screen patient shortly for MRI scan. Pt notified

## 2017-03-17 NOTE — Consult Note (Signed)
   [facility] TeleNeurology Consult Services  Date of Service: 03/17/17  Impression:  acute, transient vision changes, left face and right arm numbness - concerning for posterior circulation TIA, though complicated migraine is also possible. Recommend admission for TIA workup.   Recommendations:  Tele DVT proph - lovenox permissive htn PT/OT/speech bedside swallow eval ASA Inpatient neuro consult  ---------------------------------------------------------------------  CC: difficulty reading, left face, and right arm numbness  History of Present Illness:  77 yo woman who was sitting at church and noted that she was unable to read the Bible verse on the screen. Her left face and arm right went numb. She notes similar occurences in the past year that were considered complicated migraines. No LOC/convulsion. No trouble speaking/swallowing. Her symptoms lasted for approx 30 minutes; she is back to baseline. She takes a baby ASA daily.   Diagnostic Testing: CT head unremarkable.   Vital Signs:   Vitals:   03/17/17 1220 03/17/17 1223  BP: (!) 143/75   Pulse: 84   Resp: (!) 21   Temp:  97.6 F (36.4 C)  SpO2: 99%      Exam:   RESULT SUMMARY: 1 points NIH Stroke Scale   INPUTS: 1A: Level of consciousness -> 0 = Alert; keenly responsive 1B: Ask month and age -> 1 = 1 question right 1C: 'Blink eyes' & 'squeeze hands' -> 0 = Performs both tasks 2: Horizontal extraocular movements -> 0 = Normal 3: Visual fields -> 0 = No visual loss 4: Facial palsy -> 0 = Normal symmetry 5A: Left arm motor drift -> 0 = No drift for 10 seconds 5B: Right arm motor drift -> 0 = No drift for 10 seconds 6A: Left leg motor drift -> 0 = No drift for 5 seconds 6B: Right leg motor drift -> 0 = No drift for 5 seconds 7: Limb Ataxia -> 0 = No ataxia 8: Sensation -> 0 = Normal; no sensory loss 9: Language/aphasia -> 0 = Normal; no aphasia 10: Dysarthria -> 0 = Normal 11: Extinction/inattention  -> 0 = No abnormality  Medical Decision Making:  - Extensive number of diagnosis or management options are considered above.   - Extensive amount of complex data reviewed.   - High risk of complication and/or morbidity or mortality are associated with differential diagnostic considerations above.  - There may be uncertain outcome and increased probability of prolonged functional impairment or high probability of severe prolonged functional impairment associated with some of these differential diagnosis.   Medical Data Reviewed:  1.Data reviewed include clinical labs, radiology,  Medical Tests;   2.Tests results discussed w/performing or interpreting physician;   3.Obtaining/reviewing old medical records;  4.Obtaining case history from another source;  5.Independent review of image, tracing or specimen.    Patient was informed the Neurology Consult would happen via telehealth (remote video) and consented to receiving care in this manner.

## 2017-03-17 NOTE — Discharge Instructions (Signed)
From Dr. Shaune PollackLord: You were evaluated after neurologic symptoms which are now completely resolved, and as we discussed, I am suspicious of mini stroke also called transient ischemic attack or TIA.  You are recommended to continue your baby aspirin daily and add Plavix.  You need to follow-up both with your primary care doctor, as well as a neurologist which is provided.  Return to the emergency department immediately for any return of neurologic symptoms including slurred speech, vision problems, facial droop, weakness, numbness, confusion or altered mental status, or any other symptoms concerning to you.

## 2017-12-30 ENCOUNTER — Other Ambulatory Visit: Payer: Self-pay | Admitting: Family Medicine

## 2017-12-30 DIAGNOSIS — Z1231 Encounter for screening mammogram for malignant neoplasm of breast: Secondary | ICD-10-CM

## 2017-12-31 ENCOUNTER — Ambulatory Visit
Admission: RE | Admit: 2017-12-31 | Discharge: 2017-12-31 | Disposition: A | Discharge status: Home / Self Care | Payer: Medicare Other | Source: Ambulatory Visit | Attending: Family Medicine | Admitting: Family Medicine

## 2017-12-31 DIAGNOSIS — Z1231 Encounter for screening mammogram for malignant neoplasm of breast: Secondary | ICD-10-CM

## 2018-10-25 IMAGING — MR MR HEAD W/O CM
9 of 12 series · 32 of 48 positions shown · non-contrast
Comparison: 10/17/2016 CT of the head

CLINICAL DATA: 76 y/o  F; TIA, initial examination.

EXAM:
MRI HEAD WITHOUT CONTRAST
MRA HEAD WITHOUT CONTRAST
TECHNIQUE: Multiplanar, multiecho pulse sequences of the brain and surrounding
structures were obtained without intravenous contrast. Angiographic
images of the head were obtained using MRA technique without
contrast.

[Series 3: DWI · axial · 3.0mm · 1.09mm/px · z∈[-142,-2]mm · 7 of 96 slices shown (1 of 4)]
[im 1/96]
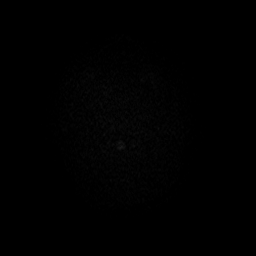
[im 16/96]
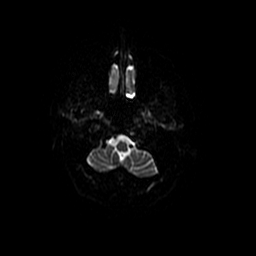
[im 32/96]
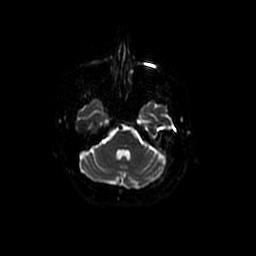
[im 48/96]
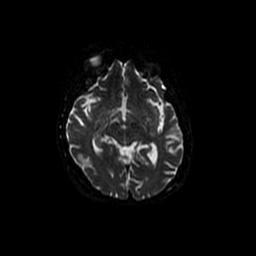
[im 64/96]
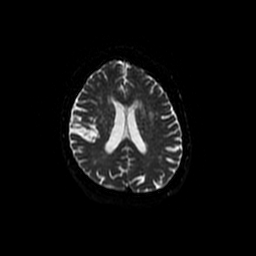
[im 80/96]
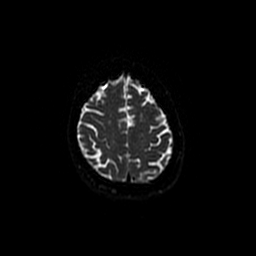
[im 96/96]
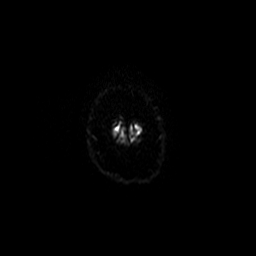

[Series 4: (id) mt fs · axial · 1.4mm · 0.43mm/px · z∈[-126,-73]mm · 5 of 136 slices shown]
[im 1/136]
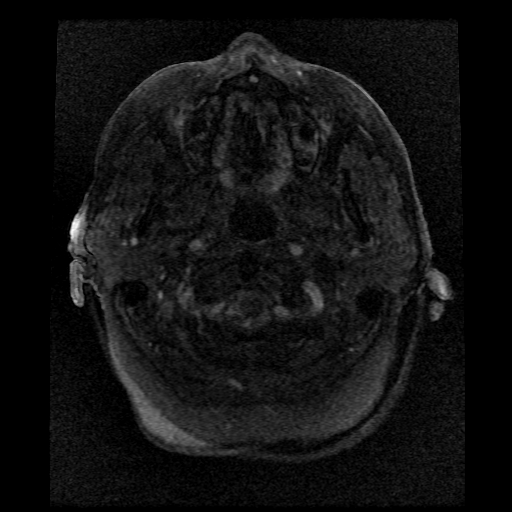
[im 16/136]
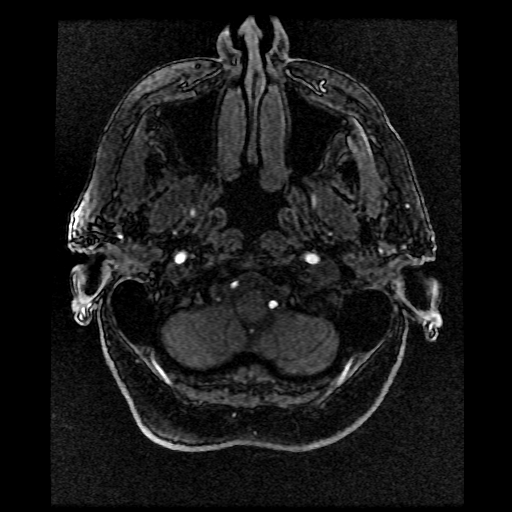
[im 46/136]
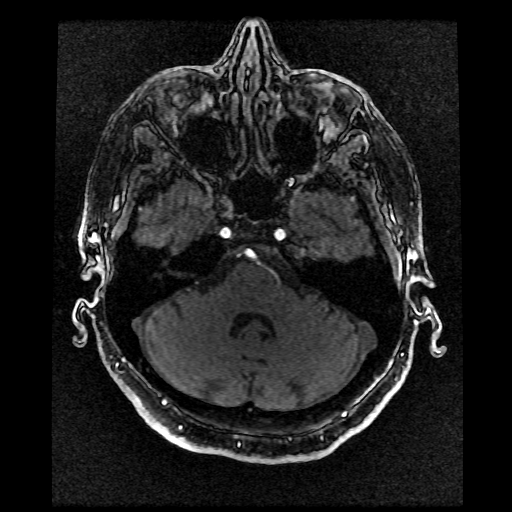
[im 61/136]
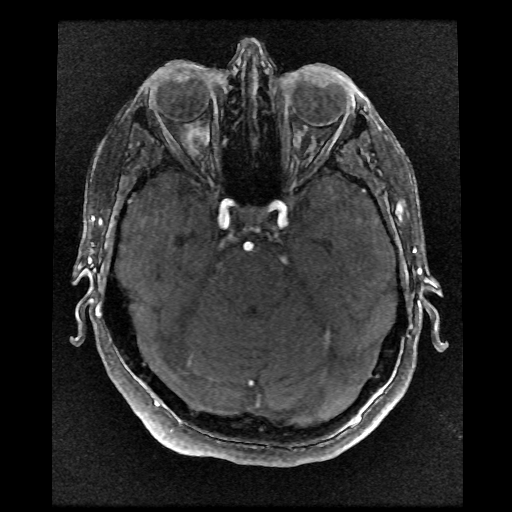
[im 76/136]
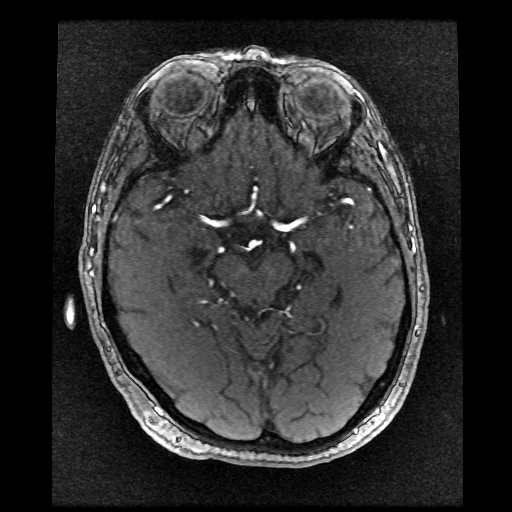

[Series 5: DWI · coronal · 5.0mm · 1.09mm/px · 5 of 72 slices shown (2 of 4)]
[im 1/72]
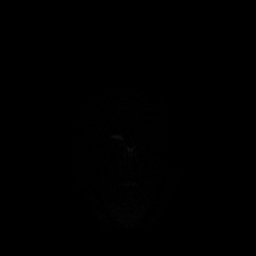
[im 18/72]
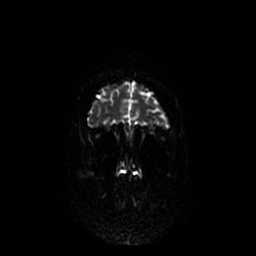
[im 36/72]
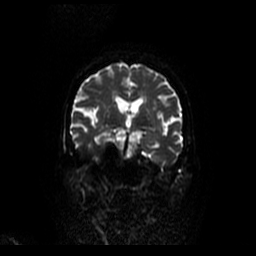
[im 54/72]
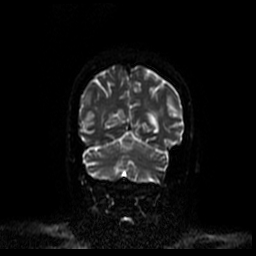
[im 72/72]
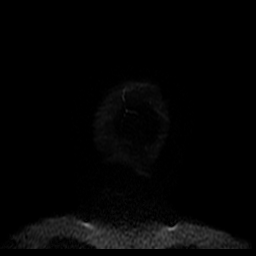

[Series 6: T1 · sagittal · 5.0mm · 0.47mm/px · 2 of 23 slices shown]
[im 1/23]
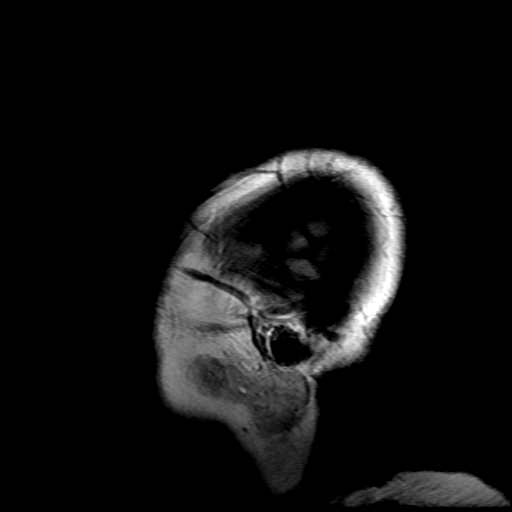
[im 23/23]
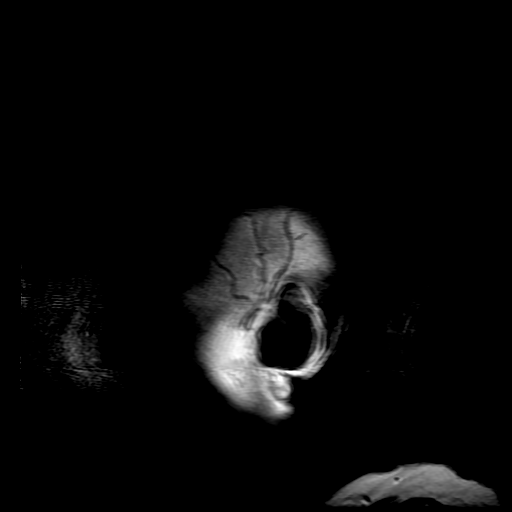

[Series 7: T2 · axial · 5.0mm · 0.43mm/px · z∈[-137,+1]mm · 2 of 24 slices shown (1 of 2)]
[im 1/24]
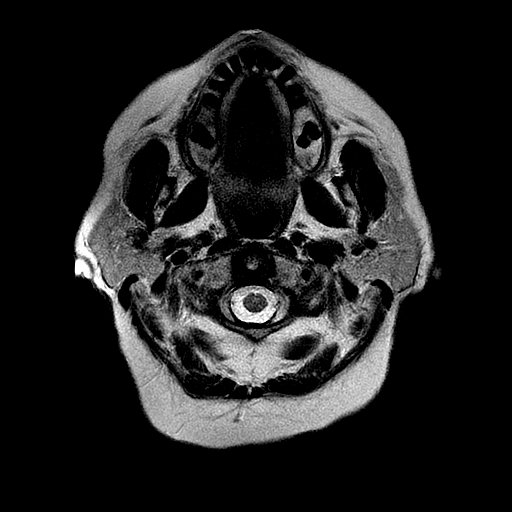
[im 24/24]
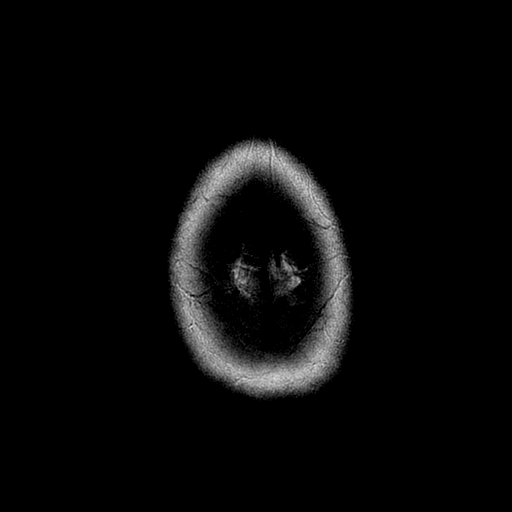

[Series 8: FLAIR · axial · 5.0mm · 0.43mm/px · z∈[-137,+1]mm · 2 of 24 slices shown]
[im 1/24]
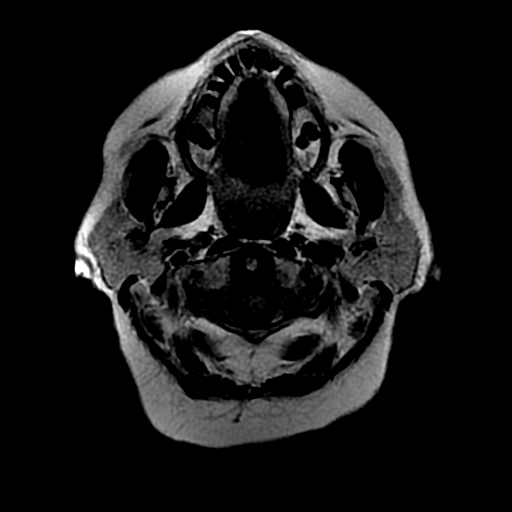
[im 24/24]
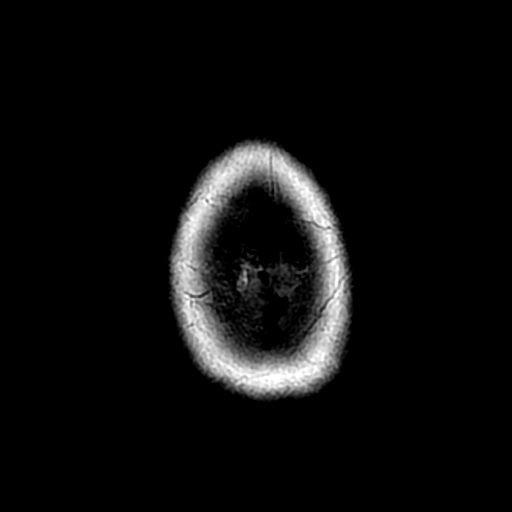

[Series 11: T2 · coronal · 5.0mm · 0.43mm/px · 2 of 24 slices shown (2 of 2)]
[im 1/24]
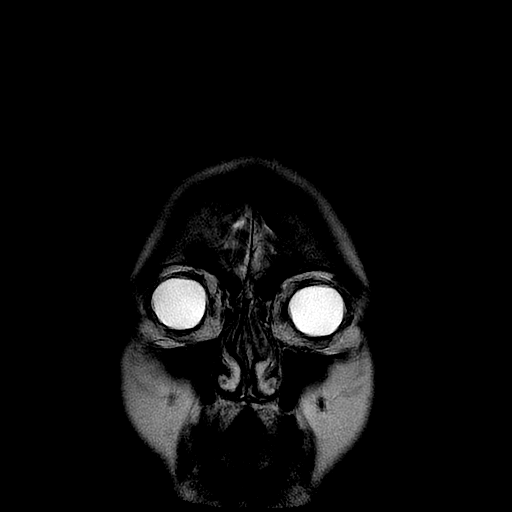
[im 24/24]
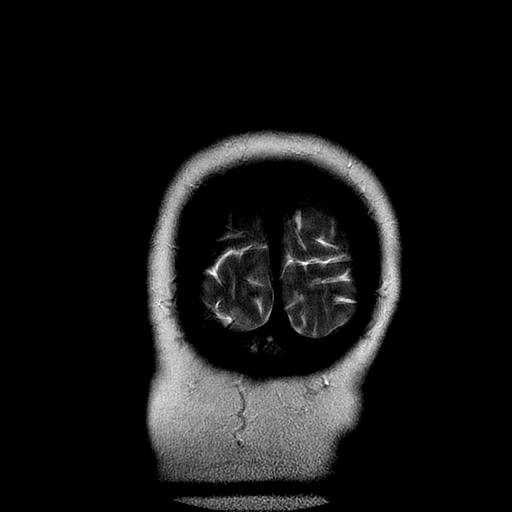

[Series 300: DWI · axial · 3.0mm · 1.09mm/px · z∈[-142,-2]mm · 4 of 48 slices shown (3 of 4)]
[im 1/48]
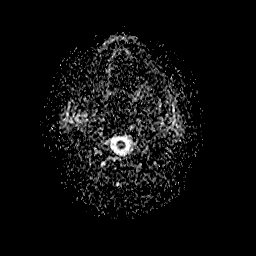
[im 16/48]
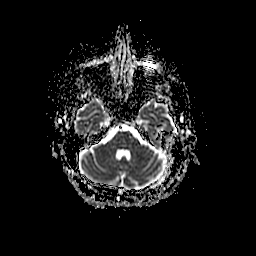
[im 32/48]
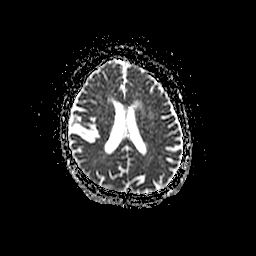
[im 48/48]
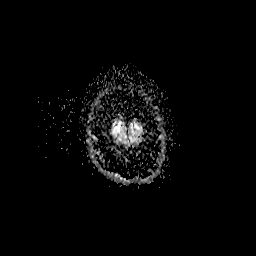

[Series 500: DWI · coronal · 5.0mm · 1.09mm/px · 3 of 36 slices shown (4 of 4)]
[im 1/36]
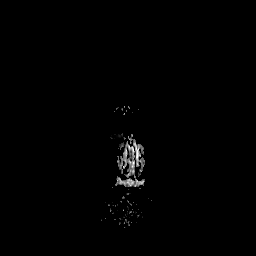
[im 18/36]
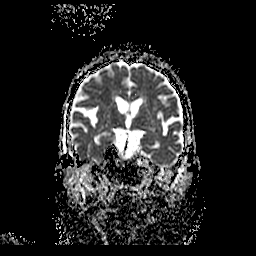
[im 36/36]
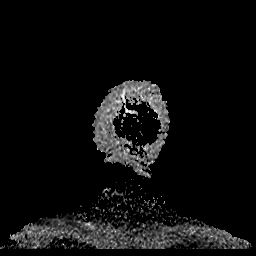

[32 of 48 positions shown; findings below may reference images not displayed]

FINDINGS: MRI HEAD FINDINGS

Brain: No acute infarction, hemorrhage, hydrocephalus, extra-axial
collection or mass lesion. Several nonspecific foci of T2 FLAIR
hyperintense signal abnormality in subcortical and periventricular
white matter are compatible with mild chronic microvascular ischemic
changes for age. Mild brain parenchymal volume loss. Small chronic
lacunar infarct within the right caudate head.

Vascular: As below.

Skull and upper cervical spine: Normal marrow signal.

Sinuses/Orbits: Negative.

Other: None.

MRA HEAD FINDINGS

Internal carotid arteries: Patent. Mild irregularity of carotid
siphons without significant stenosis compatible with
atherosclerosis.

Anterior cerebral arteries:  Patent.

Middle cerebral arteries: Patent.

Anterior communicating artery: Patent.

Posterior communicating arteries:  Patent.

Posterior cerebral arteries: Patent. Left P1 short segment of mild
stenosis and right P2 short segment of moderate stenosis.

Basilar artery:  Patent.

Vertebral arteries:  Patent.

No evidence of high-grade stenosis, large vessel occlusion, or
aneurysm unless noted above.
IMPRESSION: MRI head:

1. No acute intracranial abnormality identified.
2. Mild for age chronic microvascular ischemic changes and mild
parenchymal volume loss of the brain. Small chronic lacunar infarct
in right caudate head.
MRA head:

1. Patent circle of Willis. No large vessel occlusion, aneurysm, or
significant stenosis is identified.
2. Intracranial atherosclerosis with mild non stenotic irregularity
of carotid siphons and segments of mild-to-moderate stenosis in the
proximal posterior cerebral arteries.

By: Alia Tiger M.D.

## 2018-12-26 ENCOUNTER — Other Ambulatory Visit: Payer: Self-pay | Admitting: Family Medicine

## 2018-12-26 DIAGNOSIS — Z1231 Encounter for screening mammogram for malignant neoplasm of breast: Secondary | ICD-10-CM

## 2019-01-02 ENCOUNTER — Ambulatory Visit
Admission: RE | Admit: 2019-01-02 | Discharge: 2019-01-02 | Disposition: A | Discharge status: Home / Self Care | Payer: Medicare Other | Source: Ambulatory Visit | Attending: Family Medicine | Admitting: Family Medicine

## 2019-01-02 DIAGNOSIS — Z1231 Encounter for screening mammogram for malignant neoplasm of breast: Secondary | ICD-10-CM | POA: Diagnosis present

## 2019-07-01 ENCOUNTER — Other Ambulatory Visit: Payer: Self-pay

## 2019-07-01 DIAGNOSIS — N6321 Unspecified lump in the left breast, upper outer quadrant: Secondary | ICD-10-CM

## 2019-07-02 ENCOUNTER — Other Ambulatory Visit: Payer: Self-pay

## 2019-07-02 DIAGNOSIS — N6321 Unspecified lump in the left breast, upper outer quadrant: Secondary | ICD-10-CM

## 2019-07-06 ENCOUNTER — Ambulatory Visit
Admission: RE | Admit: 2019-07-06 | Discharge: 2019-07-06 | Disposition: A | Discharge status: Home / Self Care | Payer: Medicare Other | Source: Ambulatory Visit

## 2019-07-06 DIAGNOSIS — N6321 Unspecified lump in the left breast, upper outer quadrant: Secondary | ICD-10-CM | POA: Diagnosis present

## 2019-07-07 ENCOUNTER — Other Ambulatory Visit: Payer: Self-pay

## 2019-07-07 DIAGNOSIS — N63 Unspecified lump in unspecified breast: Secondary | ICD-10-CM

## 2019-07-07 DIAGNOSIS — R928 Other abnormal and inconclusive findings on diagnostic imaging of breast: Secondary | ICD-10-CM

## 2019-07-07 DIAGNOSIS — N6320 Unspecified lump in the left breast, unspecified quadrant: Secondary | ICD-10-CM

## 2019-07-29 ENCOUNTER — Ambulatory Visit: Payer: Medicare Other | Admitting: Dermatology

## 2019-11-11 ENCOUNTER — Other Ambulatory Visit: Payer: Self-pay

## 2019-11-11 ENCOUNTER — Ambulatory Visit (INDEPENDENT_AMBULATORY_CARE_PROVIDER_SITE_OTHER): Payer: Medicare Other | Admitting: Dermatology

## 2019-11-11 DIAGNOSIS — L821 Other seborrheic keratosis: Secondary | ICD-10-CM

## 2019-11-11 DIAGNOSIS — L578 Other skin changes due to chronic exposure to nonionizing radiation: Secondary | ICD-10-CM

## 2019-11-11 DIAGNOSIS — L570 Actinic keratosis: Secondary | ICD-10-CM

## 2019-11-11 DIAGNOSIS — L814 Other melanin hyperpigmentation: Secondary | ICD-10-CM | POA: Diagnosis not present

## 2019-11-11 NOTE — Progress Notes (Signed)
   New Patient Visit  Subjective  Rachel Thompson is a 79 y.o. female who presents for the following: Other (SPot of nose x ~1 year - Dr. Madelin Rear told her it was a precancer). She is got other spots to be checked.  The following portions of the chart were reviewed this encounter and updated as appropriate:  Tobacco  Allergies  Meds  Problems  Med Hx  Surg Hx  Fam Hx     Review of Systems:  No other skin or systemic complaints except as noted in HPI or Assessment and Plan.  Objective  Well appearing patient in no apparent distress; mood and affect are within normal limits.  A focused examination was performed including face. Relevant physical exam findings are noted in the Assessment and Plan.  Objective  Head - Anterior (Face) (2): Erythematous thin papules/macules with gritty scale.    Assessment & Plan    Actinic Damage - diffuse scaly erythematous macules with underlying dyspigmentation - Recommend daily broad spectrum sunscreen SPF 30+ to sun-exposed areas, reapply every 2 hours as needed.  - Call for new or changing lesions.  Lentigines - Scattered tan macules - Discussed due to sun exposure - Benign, observe - Call for any changes  Seborrheic Keratoses - Stuck-on, waxy, tan-brown papules and plaques  - Discussed benign etiology and prognosis. - Observe - Call for any changes  AK (actinic keratosis) (2) Head - Anterior (Face)  Destruction of lesion - Head - Anterior (Face) Complexity: simple   Destruction method: cryotherapy   Informed consent: discussed and consent obtained   Timeout:  patient name, date of birth, surgical site, and procedure verified Lesion destroyed using liquid nitrogen: Yes   Region frozen until ice ball extended beyond lesion: Yes   Outcome: patient tolerated procedure well with no complications   Post-procedure details: wound care instructions given    Return in about 4 months (around 03/13/2020) for AK follow up.  I, Joanie Coddington,  CMA, am acting as scribe for Armida Sans, MD .  Documentation: I have reviewed the above documentation for accuracy and completeness, and I agree with the above.  Armida Sans, MD

## 2019-11-11 NOTE — Patient Instructions (Signed)

## 2019-11-12 ENCOUNTER — Encounter: Payer: Self-pay | Admitting: Dermatology

## 2019-12-07 ENCOUNTER — Ambulatory Visit: Payer: Medicare Other | Attending: Internal Medicine

## 2019-12-07 ENCOUNTER — Ambulatory Visit: Payer: Medicare Other

## 2019-12-07 DIAGNOSIS — Z23 Encounter for immunization: Secondary | ICD-10-CM

## 2019-12-07 NOTE — Progress Notes (Signed)
   Covid-19 Vaccination [facility]  Name:  Rachel Thompson    MRN: [id number] DOB: [birth year]  12/07/2019  [name] was observed post Covid-19 immunization for 15 minutes without incident. She was provided with Vaccine Information Sheet and instruction to access the V-Safe system.   [name] was instructed to call 911 with any severe reactions post vaccine: Marland Kitchen Difficulty breathing  . Swelling of face and throat  . A fast heartbeat  . A bad rash all over body  . Dizziness and weakness   Immunizations Administered    Name Date Dose VIS Date Route   Pfizer COVID-19 Vaccine 12/07/2019  2:02 PM 0.3 mL 11/11/2019 Intramuscular   Manufacturer: [employer], [employer]   Lot: [id number]   NDC: [address]-5

## 2019-12-14 ENCOUNTER — Ambulatory Visit: Payer: Medicare Other

## 2020-03-17 ENCOUNTER — Ambulatory Visit: Payer: Medicare Other | Admitting: Dermatology

## 2020-04-20 ENCOUNTER — Other Ambulatory Visit: Payer: Self-pay

## 2020-04-20 ENCOUNTER — Ambulatory Visit (INDEPENDENT_AMBULATORY_CARE_PROVIDER_SITE_OTHER): Payer: Medicare Other | Admitting: Dermatology

## 2020-04-20 DIAGNOSIS — L570 Actinic keratosis: Secondary | ICD-10-CM

## 2020-04-20 DIAGNOSIS — L814 Other melanin hyperpigmentation: Secondary | ICD-10-CM

## 2020-04-20 DIAGNOSIS — L578 Other skin changes due to chronic exposure to nonionizing radiation: Secondary | ICD-10-CM

## 2020-04-20 DIAGNOSIS — L821 Other seborrheic keratosis: Secondary | ICD-10-CM | POA: Diagnosis not present

## 2020-04-20 NOTE — Progress Notes (Signed)
   Follow-Up Visit   Subjective  Rachel Thompson is a 80 y.o. female who presents for the following: Follow-up (5 months f/u hx of Aks on the face ).  The following portions of the chart were reviewed this encounter and updated as appropriate:   Tobacco  Allergies  Meds  Problems  Med Hx  Surg Hx  Fam Hx     Review of Systems:  No other skin or systemic complaints except as noted in HPI or Assessment and Plan.  Objective  Well appearing patient in no apparent distress; mood and affect are within normal limits.  A focused examination was performed including face. Relevant physical exam findings are noted in the Assessment and Plan.  Objective  face x 2 (2): Erythematous thin papules/macules with gritty scale.    Assessment & Plan  AK (actinic keratosis) (2) face x 2  Destruction of lesion - face x 2 Complexity: simple   Destruction method: cryotherapy   Informed consent: discussed and consent obtained   Timeout:  patient name, date of birth, surgical site, and procedure verified Lesion destroyed using liquid nitrogen: Yes   Region frozen until ice ball extended beyond lesion: Yes   Outcome: patient tolerated procedure well with no complications   Post-procedure details: wound care instructions given    Actinic Damage - chronic, secondary to cumulative UV radiation exposure/sun exposure over time - diffuse scaly erythematous macules with underlying dyspigmentation - Recommend daily broad spectrum sunscreen SPF 30+ to sun-exposed areas, reapply every 2 hours as needed.  - Recommend staying in the shade or wearing long sleeves, sun glasses (UVA+UVB protection) and wide brim hats (4-inch brim around the entire circumference of the hat). - Call for new or changing lesions.  Lentigines - Scattered tan macules - Due to sun exposure - Benign-appering, observe - Recommend daily broad spectrum sunscreen SPF 30+ to sun-exposed areas, reapply every 2 hours as needed. - Call for  any changes  Seborrheic Keratoses - Stuck-on, waxy, tan-brown papules and/or plaques  - Benign-appearing - Discussed benign etiology and prognosis. - Observe - Call for any changes  Return in about 1 year (around 04/20/2021) for Aks .  IAngelique Holm, CMA, am acting as scribe for Armida Sans, MD .  Documentation: I have reviewed the above documentation for accuracy and completeness, and I agree with the above.  Armida Sans, MD

## 2020-04-20 NOTE — Patient Instructions (Addendum)
Cryotherapy Aftercare  . Wash gently with soap and water everyday.   . Apply Vaseline and Band-Aid daily until healed.  

## 2020-04-21 ENCOUNTER — Encounter: Payer: Self-pay | Admitting: Dermatology

## 2021-04-20 ENCOUNTER — Encounter: Payer: Medicare Other | Admitting: Dermatology
